# Patient Record
Sex: Male | Born: 1975 | Hispanic: Yes | Marital: Married | Smoking: Never smoker
Health system: Northeastern US, Community
[De-identification: ages and names within clinical notes are randomized; demographics above are authoritative.]

---

## 2000-11-19 ENCOUNTER — Ambulatory Visit: Payer: Self-pay | Admitting: Family Medicine

## 2000-11-19 ENCOUNTER — Ambulatory Visit (HOSPITAL_BASED_OUTPATIENT_CLINIC_OR_DEPARTMENT_OTHER): Payer: Self-pay | Admitting: Family Medicine

## 2001-07-12 ENCOUNTER — Ambulatory Visit: Payer: Self-pay | Admitting: Family Medicine

## 2001-08-25 ENCOUNTER — Emergency Department (HOSPITAL_BASED_OUTPATIENT_CLINIC_OR_DEPARTMENT_OTHER): Payer: Self-pay | Admitting: Emergency Medicine

## 2001-08-30 LAB — ORBITS  3 VIEWS

## 2004-08-29 ENCOUNTER — Emergency Department: Payer: Self-pay

## 2004-08-30 ENCOUNTER — Emergency Department: Payer: Self-pay | Admitting: Emergency Medicine

## 2004-09-01 ENCOUNTER — Ambulatory Visit (HOSPITAL_BASED_OUTPATIENT_CLINIC_OR_DEPARTMENT_OTHER): Payer: Self-pay | Admitting: Ophthalmology

## 2004-10-22 ENCOUNTER — Emergency Department: Payer: Self-pay | Admitting: Emergency Medicine

## 2004-10-25 LAB — THROAT CULTURE BETA STREP

## 2004-10-31 ENCOUNTER — Ambulatory Visit (HOSPITAL_BASED_OUTPATIENT_CLINIC_OR_DEPARTMENT_OTHER): Payer: Self-pay | Admitting: Internal Medicine

## 2004-10-31 ENCOUNTER — Emergency Department (HOSPITAL_BASED_OUTPATIENT_CLINIC_OR_DEPARTMENT_OTHER): Payer: Self-pay | Admitting: Emergency Medicine

## 2004-10-31 LAB — XR CHEST 2 VIEWS

## 2004-11-02 LAB — THROAT CULTURE BETA STREP

## 2004-11-12 ENCOUNTER — Ambulatory Visit: Payer: Self-pay

## 2005-06-03 ENCOUNTER — Ambulatory Visit (HOSPITAL_BASED_OUTPATIENT_CLINIC_OR_DEPARTMENT_OTHER): Payer: Charity | Admitting: Ophthalmology

## 2005-06-12 ENCOUNTER — Ambulatory Visit (HOSPITAL_BASED_OUTPATIENT_CLINIC_OR_DEPARTMENT_OTHER): Payer: Charity | Admitting: Ophthalmology

## 2005-06-12 DIAGNOSIS — H25049 Posterior subcapsular polar age-related cataract, unspecified eye: Principal | ICD-10-CM

## 2005-06-26 ENCOUNTER — Other Ambulatory Visit (HOSPITAL_BASED_OUTPATIENT_CLINIC_OR_DEPARTMENT_OTHER): Payer: Charity

## 2005-07-10 ENCOUNTER — Ambulatory Visit: Payer: Self-pay

## 2005-07-10 ENCOUNTER — Other Ambulatory Visit (HOSPITAL_BASED_OUTPATIENT_CLINIC_OR_DEPARTMENT_OTHER): Payer: Charity

## 2005-07-10 DIAGNOSIS — H269 Unspecified cataract: Principal | ICD-10-CM

## 2005-07-10 DIAGNOSIS — Z23 Encounter for immunization: Secondary | ICD-10-CM

## 2005-07-10 DIAGNOSIS — Z124 Encounter for screening for malignant neoplasm of cervix: Secondary | ICD-10-CM

## 2005-07-10 LAB — BLOOD COUNT COMPLETE AUTOMATED
HEMATOCRIT: 49 % (ref 42.0–52.0)
HEMOGLOBIN: 16.5 g/dL (ref 14.0–18.0)
MEAN CORP HGB CONC: 33.6 g/dL (ref 32.0–36.0)
MEAN CORPUSCULAR HGB: 31.1 pg — ABNORMAL HIGH (ref 27.0–31.0)
MEAN CORPUSCULAR VOL: 92.4 fL (ref 80.0–94.0)
MEAN PLATELET VOLUME: 8.5 fL (ref 6.4–10.8)
PLATELET COUNT: 317 10*3/uL (ref 150–400)
RBC DISTRIBUTION WIDTH: 11.4 % — ABNORMAL LOW (ref 11.5–14.3)
RED BLOOD CELL COUNT: 5.3 MIL/uL (ref 4.70–6.10)
WHITE BLOOD CELL COUNT: 9.3 10*3/uL (ref 4.8–10.8)

## 2005-07-10 LAB — GLUCOSE FASTING: GLUCOSE FASTING: 85 mg/dl (ref 74–118)

## 2005-07-10 LAB — CHG LIPID PANEL
Cholesterol: 161 mg/dl (ref 0–200)
HIGH DENSITY LIPOPROTEIN: 44 mg/dl (ref 29–71)
LOW DENSITY LIPOPROTEIN DIRECT: 116 mg/dl — ABNORMAL HIGH (ref 0–100)
RISK FACTOR: 3.7 (ref ?–5.0)
TRIGLYCERIDES: 36 mg/dl (ref 0–150)

## 2005-07-10 LAB — APTT: APTT: 29.4 SECONDS (ref 22.0–32.8)

## 2005-07-10 LAB — PROTHROMBIN TIME
INR: 1 — ABNORMAL LOW (ref 2.0–3.5)
PROTHROMBIN TIME: 12.4 SECONDS (ref 11.5–13.5)

## 2005-07-13 ENCOUNTER — Other Ambulatory Visit (HOSPITAL_BASED_OUTPATIENT_CLINIC_OR_DEPARTMENT_OTHER): Payer: Charity

## 2005-07-13 DIAGNOSIS — H2510 Age-related nuclear cataract, unspecified eye: Principal | ICD-10-CM

## 2005-07-15 ENCOUNTER — Encounter (HOSPITAL_BASED_OUTPATIENT_CLINIC_OR_DEPARTMENT_OTHER): Payer: Self-pay | Admitting: Ophthalmology

## 2005-07-15 HISTORY — PX: CATARACT REMOVAL INSERTION OF LENS: OPH121

## 2005-07-16 ENCOUNTER — Encounter (HOSPITAL_BASED_OUTPATIENT_CLINIC_OR_DEPARTMENT_OTHER): Payer: Charity | Admitting: Ophthalmology

## 2005-07-16 DIAGNOSIS — H2510 Age-related nuclear cataract, unspecified eye: Principal | ICD-10-CM

## 2005-07-16 LAB — ~~LOC~~-OPERATIVE REPORT

## 2005-07-23 ENCOUNTER — Ambulatory Visit (HOSPITAL_BASED_OUTPATIENT_CLINIC_OR_DEPARTMENT_OTHER): Payer: Charity | Admitting: Ophthalmology

## 2005-07-23 DIAGNOSIS — H26109 Unspecified traumatic cataract, unspecified eye: Principal | ICD-10-CM

## 2005-07-29 ENCOUNTER — Encounter: Payer: Charity | Admitting: Ophthalmology

## 2005-07-29 DIAGNOSIS — H2510 Age-related nuclear cataract, unspecified eye: Principal | ICD-10-CM

## 2005-08-19 ENCOUNTER — Ambulatory Visit (HOSPITAL_BASED_OUTPATIENT_CLINIC_OR_DEPARTMENT_OTHER): Payer: Charity | Admitting: Ophthalmology

## 2005-08-19 DIAGNOSIS — H2510 Age-related nuclear cataract, unspecified eye: Secondary | ICD-10-CM

## 2005-08-19 DIAGNOSIS — H35379 Puckering of macula, unspecified eye: Secondary | ICD-10-CM

## 2005-08-19 DIAGNOSIS — S0510XA Contusion of eyeball and orbital tissues, unspecified eye, initial encounter: Principal | ICD-10-CM

## 2005-08-26 ENCOUNTER — Emergency Department: Payer: Self-pay

## 2005-09-10 ENCOUNTER — Emergency Department: Payer: Self-pay | Admitting: Emergency Medicine

## 2005-11-18 ENCOUNTER — Ambulatory Visit (HOSPITAL_BASED_OUTPATIENT_CLINIC_OR_DEPARTMENT_OTHER): Payer: Self-pay | Admitting: Ophthalmology

## 2006-08-19 ENCOUNTER — Emergency Department: Payer: Self-pay | Admitting: Emergency Medicine

## 2006-08-22 LAB — THROAT CULTURE BETA STREP

## 2006-08-23 LAB — EMERGENCY ROOM NOTE

## 2006-09-08 ENCOUNTER — Emergency Department: Payer: Self-pay | Admitting: Emergency Medicine

## 2006-09-08 LAB — XR CHEST 2 VIEWS

## 2006-09-14 ENCOUNTER — Ambulatory Visit: Payer: PRIVATE HEALTH INSURANCE | Admitting: Internal Medicine

## 2006-09-16 LAB — EMERGENCY ROOM NOTE

## 2006-09-24 ENCOUNTER — Ambulatory Visit: Payer: PRIVATE HEALTH INSURANCE | Admitting: Internal Medicine

## 2006-09-24 VITALS — BP 105/70 | HR 76 | Temp 97.6°F | Wt 115.0 lb

## 2006-09-24 DIAGNOSIS — J209 Acute bronchitis, unspecified: Principal | ICD-10-CM

## 2006-09-24 DIAGNOSIS — R634 Abnormal weight loss: Secondary | ICD-10-CM

## 2006-09-24 NOTE — Progress Notes (Signed)
Former patient of Dr. Volney American.     1. ER follow-up: bronchitis. Resolved. No smoking.     2. Occasional L abdo pain/gas after meals. No certain food link. No diarrhea. No melena.     3. Occasional white spots in tonsilar regions. Hx of surgery for tonsils.     4. Unhappy with body habitus. Wishes larger, stronger frame.    Patient Active Problem List:   CATARACT NOS [366.9]    No current outpatient prescriptions on file prior to 09/24/06.    Review of Patient's Allergies indicates:   Nkda (no known drug*     Social History Main Topics   Tobacco Use: Never    Alcohol Use: No    Drug Use: No      BP 105/70  Pulse 76  Temp 97.6  Wt 115 lbs (52.2kg)  SaO2 99%  Thin, but well appearing M.   No oral lesions.  No neck nodes.  CTA  Reg rhythm. Trace early syst murmur LSB.  No LE edema.     A/P:    466.0 ACUTE BRONCHITIS (primary encounter diagnosis)  Note: Treated through the ED. Resolved.   Plan: F/u if recurrent symptoms.    783.2 ABNORMAL LOSS WEIGHT/UNDERWEIGHT  Note: Small frame. Lifelong. Wishes larger, stronger. Not using any anabolic Rx's.   Plan: Reviewed good nutrition, safe protein intake, weight training.   Cautioned about the dangers of anabolic steroids and other growth/performance enhancers.   Nutrition referral if desired. Prefers to wait for now.

## 2006-09-24 NOTE — Patient Instructions (Signed)
1. Come mas proteina.  2. [employer] brand protein supplements.

## 2006-10-20 ENCOUNTER — Emergency Department: Payer: Self-pay | Admitting: Emergency Medicine

## 2006-10-20 LAB — EMERGENCY ROOM NOTE

## 2007-01-07 ENCOUNTER — Ambulatory Visit: Payer: PRIVATE HEALTH INSURANCE | Admitting: Internal Medicine

## 2007-02-21 ENCOUNTER — Emergency Department (HOSPITAL_BASED_OUTPATIENT_CLINIC_OR_DEPARTMENT_OTHER)
Admission: RE | Admit: 2007-02-21 | Disposition: A | Discharge status: Home / Self Care | Payer: Self-pay | Source: Emergency Department | Attending: Emergency Medicine | Admitting: Emergency Medicine

## 2007-02-21 LAB — XR CHEST 2 VIEWS

## 2007-02-26 LAB — EMERGENCY ROOM NOTE

## 2007-09-21 ENCOUNTER — Other Ambulatory Visit: Payer: Self-pay

## 2007-10-12 ENCOUNTER — Ambulatory Visit (HOSPITAL_BASED_OUTPATIENT_CLINIC_OR_DEPARTMENT_OTHER): Payer: Self-pay | Admitting: Internal Medicine

## 2007-12-12 NOTE — Telephone Encounter (Signed)
Former pt of Dr Berneda Rose not seen since 09/24/06. Letter sent for pt to establish new PCP.

## 2008-11-01 ENCOUNTER — Encounter (HOSPITAL_BASED_OUTPATIENT_CLINIC_OR_DEPARTMENT_OTHER): Payer: Self-pay | Admitting: Ophthalmology

## 2008-11-01 ENCOUNTER — Ambulatory Visit (HOSPITAL_BASED_OUTPATIENT_CLINIC_OR_DEPARTMENT_OTHER): Payer: PRIVATE HEALTH INSURANCE | Admitting: Ophthalmology

## 2008-11-01 DIAGNOSIS — Z961 Presence of intraocular lens: Secondary | ICD-10-CM | POA: Insufficient documentation

## 2008-11-01 DIAGNOSIS — T1500XA Foreign body in cornea, unspecified eye, initial encounter: Principal | ICD-10-CM

## 2008-11-01 MED ORDER — VIGAMOX 0.5 % OP SOLN
OPHTHALMIC | Status: AC
Start: 2008-11-01 — End: 2008-11-01

## 2008-11-01 NOTE — Progress Notes (Signed)
Devin Kemp was seen c.o. Pain OD secondary to a metal EFB OD.  The metal was removed from his cornea OD.  Use vigamox qid x 5 days.  F/U in 4-5 days.

## 2008-11-01 NOTE — Nursing Note (Signed)
>>   Dina Luis-Travassos,OT     Thu Nov 01, 2008 11:51 AM  Redness OD, Severe FB Sens. OU X's 1 month, worse X's 5 days.  Pt unable to sleep from eye discomfort.

## 2008-11-07 ENCOUNTER — Ambulatory Visit (HOSPITAL_BASED_OUTPATIENT_CLINIC_OR_DEPARTMENT_OTHER): Payer: PRIVATE HEALTH INSURANCE | Admitting: Ophthalmology

## 2008-11-07 DIAGNOSIS — T1500XA Foreign body in cornea, unspecified eye, initial encounter: Principal | ICD-10-CM

## 2008-11-07 NOTE — Progress Notes (Signed)
Devin Kemp was seen s/p removal of metal EFB OD.  He has a small amt. Of residual rust with irreg overlying epithelium.  Cont vigamox tid and f/u 5-7 days/prn.

## 2008-11-07 NOTE — Nursing Note (Signed)
>>   Devin Kemp,OT     Wed Nov 07, 2008  8:31 AM  1 week F/U - FB metal removed OD.  Pt still feels FB sens OD.

## 2008-11-08 HISTORY — PX: POST-CATARACT LASER SURGERY: OPH84

## 2008-11-15 ENCOUNTER — Ambulatory Visit (HOSPITAL_BASED_OUTPATIENT_CLINIC_OR_DEPARTMENT_OTHER): Payer: PRIVATE HEALTH INSURANCE | Admitting: Ophthalmology

## 2008-11-15 DIAGNOSIS — H2640 Unspecified secondary cataract: Secondary | ICD-10-CM

## 2008-11-15 NOTE — Nursing Note (Signed)
>>   Elizabeth Silva,OT     Thu Nov 15, 2008  8:41 AM  Here for f/u corneal fb od/w rust ring, no more pain feels fine, forgets to use drop    Vigamox od tid last used 2 days ago.

## 2008-11-15 NOTE — Progress Notes (Signed)
Devin Kemp was seen for f/u metal EFB of cornea OD.  The EFB is gone with small amt. Of residual rust staining but no epithelial defect.    The patient has noted a signif decrease in vision which is due to an opacified posterior capsule.  Will sched. A YAG laser capsulotomy OD N/A.

## 2008-11-19 ENCOUNTER — Encounter (HOSPITAL_BASED_OUTPATIENT_CLINIC_OR_DEPARTMENT_OTHER): Payer: Self-pay

## 2008-12-04 ENCOUNTER — Ambulatory Visit: Payer: PRIVATE HEALTH INSURANCE | Admitting: Ophthalmology

## 2008-12-04 DIAGNOSIS — H2640 Unspecified secondary cataract: Principal | ICD-10-CM

## 2008-12-04 MED ORDER — PRED FORTE 1 % OP SUSP
OPHTHALMIC | Status: AC
Start: 2008-12-04 — End: 2009-12-04

## 2008-12-04 NOTE — Progress Notes (Signed)
The patient is having a Neodymium Yag laser capsulotomy of the right eye.  The risks, benefits and alternatives of Yag laser capsulotomy have been discussed, and the patient has had his/her questions answered satisfactorily.    PATIENT/PROCEDURE VERIFICATION DOCUMENTATION    Correct patient: Yes  Correct procedure: Yes  Correct side, site, mark visible if applicable: Yes  Correct position: Yes  Special equipment/implant(s) present, if applicable: Yes    Time-out completed, documented by provider doing procedure or designated team member:  Patton Salles    12/04/2008    4:48 PM      Neodymium Yag Laser Capsulotomy Record    Power 1.5 milliwatts    Number of shots 46*    Drawing created? No    The patient tolerated well? Yes.    The patient was prescribed Econopred 1%, one drop to the right eye, four times a day for three days.  Follow up one week to check the intraocular pressure.    If the ophthalmologist recorded the intraocular pressure in right eye, 20 minutes after the procedure the record IOP (mm HG): 20 at 5:30 PM

## 2008-12-04 NOTE — Nursing Note (Signed)
>>   Devin Kemp     Tue Dec 04, 2008  5:31 PM  For yag OD

## 2008-12-05 ENCOUNTER — Encounter (HOSPITAL_BASED_OUTPATIENT_CLINIC_OR_DEPARTMENT_OTHER): Payer: Self-pay

## 2008-12-05 ENCOUNTER — Encounter: Payer: Self-pay | Admitting: Ophthalmology

## 2008-12-05 ENCOUNTER — Emergency Department (HOSPITAL_BASED_OUTPATIENT_CLINIC_OR_DEPARTMENT_OTHER)
Admission: RE | Admit: 2008-12-05 | Disposition: A | Discharge status: Home / Self Care | Payer: Self-pay | Source: Emergency Department | Attending: Emergency Medicine | Admitting: Emergency Medicine

## 2008-12-05 MED ORDER — CLARITIN 10 MG PO TABS
ORAL_TABLET | ORAL | Status: AC
Start: 2008-12-05 — End: 2008-12-19

## 2008-12-05 MED ORDER — ZANTAC 150 MG PO TABS
ORAL_TABLET | ORAL | Status: AC
Start: 2008-12-05 — End: 2009-01-04

## 2008-12-05 MED ORDER — BENADRYL 25 MG PO TABS
ORAL_TABLET | ORAL | Status: AC
Start: 2008-12-05 — End: 2009-01-04

## 2008-12-05 NOTE — ED Notes (Signed)
Pt c/o epigatric pain for 2 days.  Pt states he took a [place] "liver pill" at noon yesterday w/o relief. Pt woke this am at 1:30 with rash to arms, denies N/V or SOB.

## 2008-12-05 NOTE — Discharge Instructions (Signed)
Please provide the following to the patient in [place]:    Your rash likely comes from an allergic reaction.  However, we cannot tell what is causing it.  If you come into contact with the same substance again your symptoms may worsen.  Allergic reactions may rarely become severe or even life threatening, so please return here immediately if your symptoms change or worsen or if you have any new problems!    We do not know the cause of your abdominal pain.  If it comes back you should return or follow up with your primary care doctor.

## 2008-12-06 LAB — EMERGENCY ROOM NOTE

## 2011-07-31 ENCOUNTER — Encounter (HOSPITAL_BASED_OUTPATIENT_CLINIC_OR_DEPARTMENT_OTHER): Payer: Self-pay

## 2011-07-31 ENCOUNTER — Emergency Department (HOSPITAL_BASED_OUTPATIENT_CLINIC_OR_DEPARTMENT_OTHER)
Admission: RE | Admit: 2011-07-31 | Disposition: A | Discharge status: Home / Self Care | Payer: Self-pay | Source: Emergency Department | Admitting: Physician Assistant

## 2011-07-31 LAB — RAPID STREP (POINT OF CARE): STREP SCREEN: NEGATIVE

## 2011-07-31 MED ORDER — MENTHOL 3 MG MT LOZG
1.00 | LOZENGE | OROMUCOSAL | Status: AC | PRN
Start: 2011-07-31 — End: 2011-08-07

## 2011-07-31 MED ORDER — ONDANSETRON 4 MG PO TBDP
4.00 mg | ORAL_TABLET | Freq: Once | ORAL | Status: AC
Start: 2011-07-31 — End: 2011-07-31
  Administered 2011-07-31: 4 mg via ORAL
  Filled 2011-07-31: qty 1

## 2011-07-31 NOTE — Discharge Instructions (Signed)
You were seen in the emergency department today for a sore throat.  This is caused by a virus.  You received antinausea medication in the emergency department.  You're being sent home with a prescription for throat lozenges which will help to relieve the sore throat.  In addition, take Motrin and Tylenol as needed for pain or fever.  Stay well hydrated and get plenty of rest.  See your doctor in 3 days if symptoms persist, return to the emergency department for any worsening symptoms.

## 2011-07-31 NOTE — ED Notes (Signed)
Patient Disposition    Patient education for diagnosis, medications, activity, diet and follow-up.  Patient left ED 5:46 PM.  Patient rep received written instructions.  Interpreter to provide instructions: Yes    Discharged to: Discharged to home, pt given for rx for cepacol lozenges, instructed to follow up with pcp, reviewed s/s to return to ed for, pt verbalizes understanding, states no other questions or concerns, pt ambulatory at dc.

## 2011-07-31 NOTE — ED Triage Note (Signed)
Pt reporting a sore throat, fever and non productive cough that started yesterday, has been taking motrin, tylenol and a [place] medication to control pain and fever.

## 2011-07-31 NOTE — ED Provider Notes (Signed)
ED nursing record was reviewed. Prior records as available electronically through the Epic record were reviewed.    Patient was seen primarily by me.      HPI:    This 35 year old male patient presents to the Emergency Department with chief complaint of sore throat x24 hours.  Patient states that he awoke yesterday morning and had a sore throat which became worse into today.  He also reports a subjective fever last night and a nonproductive cough.  He has been taking both Motrin and Tylenol (last dose at 1500) and Amoxicillin (?) which he says he obtained at a [place] [facility].  He reports several sick contacts in his immediate family.  Has felt mildly nauseas but no episodes of vomiting.  No recent travel, no abdominal pain, diarrhea, sputum production.        ROS: Pertinent positives were reviewed as per the HPI above. All other systems were reviewed and are negative.      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 Patalano, MD           Medications:   No current facility-administered medications on file prior to encounter.  Current outpatient prescriptions ordered prior to encounter:  VIGAMOX 0.5 % OP SOLN 1 gtt OU QID Disp:  Rfl:            Social History:   Social History   Marital Status: Married  Spouse Name: N/A    Years of Education: N/A  Number of Children: N/A     Occupational History  None on file     Social History Main Topics   Smoking status: Never Smoker     Smokeless tobacco:     Alcohol Use: No    Drug Use: No    Sexually Active: Not on file     Other Topics Concern   None on file     Social History Narrative   None on file           Allergies:  Review of Patient's Allergies indicates:   Nkda (no known drug*           Physical Exam:  BP 119/81  Pulse 98  Temp 98.1 F  Resp 16  Wt 54.432 kg  SpO2 99%    GENERAL: No acute distress, non-toxic.   SKIN:  Warm & Dry, no rash.  HEAD:  NCAT. Sclerae are anicteric and aninjected, oropharynx is erythematous but without tonsillar enlargement or exudate. PERRL. EOMI. B TMs clear.  NECK:  No C-spine tenderness, crepitus, step-off.  No meningismus.  No LAN. No stridor.  LUNGS:  Clear to auscultation bilaterally. No wheezes, rales, rhonchi.   HEART:  RRR.  No murmurs, rubs, or gallops.   ABDOMEN:  Soft, NTND.  No hepatosplenomegaly.  No masses.  No involuntary guarding or rebound.   EXTREMITIES:  No obvious deformities.  CSM intact x 4.     NEUROLOGIC:  Alert and oriented x4; moves all extremities well; speaking in clear fluent sentences. Sensation intact to light touch throughout. 5/5 strength globally.  PSYCHIATRIC:  Appropriate for age, time of day, and situation        ED Course and Medical Decision-making:  This 35 year old male patient presents with a sore throat and nonproductive cough x24 hours.  In the emergency department he is afebrile with normal vital signs.  On inspection of his posterior oropharynx is erythematous but without any tonsillar enlargement or exudate.  No cervical lymphadenopathy.  Lungs are clear to auscultation bilaterally.  During the exam, patient reported that he felt mildly nauseous.  He was medicated with Zofran sublingual with good effect.  Rapid strep was obtained, this was negative.  Likely a viral pharyngitis and upper respiratory infection.  Patient will be sent home with a prescription for Cepacol lozenges and was instructed to continue with both Motrin and Tylenol for fever/pain.  In addition he was provided with information regarding symptomatic management of viral illnesses.      Condition: Improved and stable  Disposition: Home      Diagnosis/Diagnoses:  462V Viral pharyngitis     Harout Scheurich L. Jodette Wik, PA-C

## 2011-08-02 LAB — THROAT CULTURE BETA STREP

## 2012-11-17 ENCOUNTER — Encounter (HOSPITAL_BASED_OUTPATIENT_CLINIC_OR_DEPARTMENT_OTHER): Payer: Self-pay | Admitting: Internal Medicine

## 2013-01-10 ENCOUNTER — Emergency Department (HOSPITAL_BASED_OUTPATIENT_CLINIC_OR_DEPARTMENT_OTHER)
Admission: RE | Admit: 2013-01-10 | Disposition: A | Discharge status: Home / Self Care | Payer: Self-pay | Source: Emergency Department | Attending: Emergency Medicine | Admitting: Emergency Medicine

## 2013-01-10 ENCOUNTER — Encounter (HOSPITAL_BASED_OUTPATIENT_CLINIC_OR_DEPARTMENT_OTHER): Payer: Self-pay

## 2013-01-10 MED ORDER — POLYMYXIN B-TRIMETHOPRIM 10000-0.1 UNIT/ML-% OP SOLN
1.00 [drp] | OPHTHALMIC | Status: AC
Start: 2013-01-10 — End: 2013-01-15

## 2013-01-10 MED ORDER — ERYTHROMYCIN 5 MG/GM OP OINT
0.50 [in_us] | TOPICAL_OINTMENT | Freq: Every evening | OPHTHALMIC | Status: AC
Start: 2013-01-10 — End: 2013-01-15

## 2013-01-10 NOTE — ED Notes (Signed)
Patient Disposition    Patient education for diagnosis, medications, activity, diet and follow-up.  Patient left ED 10:05 AM.  Patient rep received written instructions.  Interpreter to provide instructions: Yes     Discharged to home ambulatory. Verbalized understanding of d/c instructions.   Rx x 2 given.

## 2013-01-10 NOTE — ED Triage Note (Signed)
Onset of red eye with yellow discharge yesterday. Woke up today with mild lid swelling as well. Mild pain and itchiness. Denies vision changes.

## 2013-01-10 NOTE — ED Provider Notes (Signed)
I have reviewed the ED nursing notes and prior records. I have reviewed the patient's past medical history/problem list, allergies, social history and medication list.  I saw this patient primarily.        HPI:  This 37 year old male patient presents with chief complaint of right eye redness.    Symptoms started gradually yesterday.  Symptoms have been constant.  He reports associated purulent discharge.  He reports periorbital edema since today.  He denies any pain in his eye or visual changes.  He denies any associated fevers, cough, rhinorrhea or sore throat.    ROS: Pertinent positives were reviewed as per the HPI above. All other systems were reviewed and are negative.    Past Medical History/Problem List:    Past Medical History    NO KNOWN PROBLEMS      Patient Active Problem List:     Pseudophakia     After Cataract      Past Surgical History:      Past Surgical History    CATARACT REMOVAL INSERTION OF LENS  07/15/2005    Comment RIGHT EYE - [id number] Jari Favre. Patalano, MD       Medications:     No current facility-administered medications on file prior to encounter.  No current outpatient prescriptions on file prior to encounter.    Social History:     Smoking status: Never Smoker     Smokeless tobacco: Not on file    Alcohol Use: No       Allergies:  Review of Patient's Allergies indicates:   Nkda (please verify*        Physical Exam:  BP 105/71  Pulse 71  Temp(Src) 98 F  Resp 16  SpO2 100%  GENERAL:  Well-appearing, no distress.  SKIN:  Warm & Dry, no rash, no bruising.  HEAD:  Atraumatic. PERRL. EOMI. right conjunctiva injected, no discharge present.  Mild right periorbital edema.  Oropharynx clear.  NECK:  Supple, no midline tenderness, no LAD.    ED Course and Medical Decision-making:    The patient is 37 year old male with conjunctivitis.  Patient was started on topical antibiotic therapy, and advised followup with his PCP.    Reasons to return to the ED were reviewed in detail. The patient  agrees with this plan and disposition.    Disposition:  Discharged to home    Condition: Stable    Diagnosis/Diagnoses:  Conjunctivitis          Leroy Sea, MD, FACEP  Site Chief,  [facility]  [facility]

## 2013-01-10 NOTE — ED Notes (Signed)
MD at bedside for eval.

## 2013-05-06 ENCOUNTER — Emergency Department (HOSPITAL_BASED_OUTPATIENT_CLINIC_OR_DEPARTMENT_OTHER)
Admission: RE | Admit: 2013-05-06 | Disposition: A | Discharge status: Home / Self Care | Payer: Self-pay | Source: Emergency Department | Attending: Emergency Medicine | Admitting: Emergency Medicine

## 2013-05-07 ENCOUNTER — Encounter (HOSPITAL_BASED_OUTPATIENT_CLINIC_OR_DEPARTMENT_OTHER): Payer: Self-pay | Admitting: Emergency Medicine

## 2013-05-07 LAB — COMPREHENSIVE METABOLIC PANEL
ALANINE AMINOTRANSFERASE: 55 U/L — ABNORMAL HIGH (ref 12–45)
ALBUMIN: 3.8 g/dL (ref 3.4–5.0)
ALKALINE PHOSPHATASE: 76 U/L (ref 45–117)
ANION GAP: 6 mmol/L (ref 5–15)
ASPARTATE AMINOTRANSFERASE: 25 U/L (ref 8–34)
BILIRUBIN TOTAL: 0.4 mg/dL (ref 0.2–1.0)
BUN (UREA NITROGEN): 18 mg/dL (ref 7–18)
CALCIUM: 8.6 mg/dL (ref 8.5–10.1)
CARBON DIOXIDE: 32 mmol/L (ref 21–32)
CHLORIDE: 103 mmol/L (ref 98–107)
CREATININE: 1.1 mg/dL (ref 0.7–1.2)
ESTIMATED GLOMERULAR FILT RATE: >60 mL/min (ref >60)
Glucose Random: 106 mg/dL (ref 74–160)
POTASSIUM: 4.4 mmol/L (ref 3.5–5.1)
SODIUM: 141 mmol/L (ref 136–145)
TOTAL PROTEIN: 6.7 g/dL (ref 6.4–8.2)

## 2013-05-07 LAB — CBC WITH PLATELET
HEMATOCRIT: 41.2 % (ref 40.1–51.0)
HEMOGLOBIN: 14.1 g/dL (ref 13.7–17.5)
MEAN CORP HGB CONC: 34.2 g/dL (ref 31.0–37.0)
MEAN CORPUSCULAR HGB: 31 pg (ref 26.0–34.0)
MEAN CORPUSCULAR VOL: 90.5 fL (ref 80.0–100.0)
MEAN PLATELET VOLUME: 9.5 fL (ref 8.7–12.5)
PLATELET COUNT: 274 10*3/uL (ref 150–400)
RBC DISTRIBUTION WIDTH STD DEV: 37.5 fL (ref 35.1–46.3)
RBC DISTRIBUTION WIDTH: 11.5 % (ref 11.5–14.3)
RED BLOOD CELL COUNT: 4.55 M/uL — ABNORMAL LOW (ref 4.60–6.10)
WHITE BLOOD CELL COUNT: 8.5 10*3/uL (ref 4.0–11.0)

## 2013-05-07 LAB — URINALYSIS
BILIRUBIN, URINE: NEGATIVE
CASTS: NONE SEEN PER LPF
CRYSTALS: NONE SEEN
GLUCOSE, URINE: NEGATIVE MG/DL
KETONE, URINE: NEGATIVE MG/DL
LEUKOCYTE ESTERASE: NEGATIVE
NITRITE, URINE: POSITIVE — AB
OCCULT BLOOD, URINE: NEGATIVE
PH URINE: 6.5 (ref 5.0–8.0)
PROTEIN, URINE: NEGATIVE MG/DL
SPECIFIC GRAVITY URINE: 1.02 (ref 1.003–1.035)

## 2013-05-07 LAB — POC URINALYSIS
BILIRUBIN, URINE: NEGATIVE
GLUCOSE,URINE: NEGATIVE
LEUKOCYTE ESTERASE: NEGATIVE
NITRITE, URINE: NEGATIVE
OCCULT BLOOD, URINE: NEGATIVE
PH URINE: 7 (ref 5.0–8.0)
PROTEIN, URINE: NEGATIVE
SPECIFIC GRAVITY, URINE: 1.025 (ref 1.003–1.030)
UROBILINOGEN URINE: 1 — AB (ref 0.2–1.0)

## 2013-05-07 LAB — CT ABDOMEN & PELVIS WO IV CONTRAST

## 2013-05-07 MED ORDER — SODIUM CHLORIDE 0.9 % IV BOLUS
1000.00 mL | Freq: Once | INTRAVENOUS | Status: AC
Start: 2013-05-07 — End: 2013-05-07
  Administered 2013-05-07: 1000 mL via INTRAVENOUS

## 2013-05-07 NOTE — Discharge Instructions (Signed)
You were seen in the ER for right side pain.    Your CT scan showed you have very small kidney stones on both sides.  It also looks like you just passed a stone on your right side.  That may be why it felt funny when you urinated tonight.   Since your CT scan was not entirely normal, it is important to follow up with your doctor this week about it. Dr. Lodema Pilot was your doctor before.  If he can see you again, that's great.  If not, call [phone] on Monday at 9 am for a new doctor and make the first available appointment.    Rest and drink plenty of fluids.  Use ibuprofen for pain, but your pain should be much better now.  If not, you need to come back.    RETURN TO ER right away for fevers, vomiting, severe or increased pain, trouble urinating, or for any other concerns.

## 2013-05-07 NOTE — ED Provider Notes (Signed)
I have reviewed the ED nursing notes and prior records. I have reviewed the patient's past medical history/problem list, allergies, social history and medication list.  I saw this patient primarily.  [place] interpreter was used.    HPI:  This is a 37 year old male patient presenting with a chief complaint of right flank pain for 5 days, constant, worse with certain movements, better with ibuprofen, which he took 2 hours ago, so his pain is minimal now.  Had nausea and felt like he was going to throw up the first day of the pain, none now.  Just prior to arrival, he urinated and felt some discomfort while urinating but otherwise has had absolutely no urinary symptoms.  No penile discharge.  He reports a history of similar many years ago that resolved when he drank more water, so he never sought care.  The pain sometimes radiates down to his groin but he has no penile or scrotal pain.  No chest pain or trouble breathing.  No incontinence or numbness or weakness.    ROS: Pertinent positives were reviewed as per the HPI above. All other systems were reviewed and are negative.    Past Medical History/Problem List:    Past Medical History    NO KNOWN PROBLEMS      Patient Active Problem List:     Pseudophakia     After Cataract      Past Surgical History:      Past Surgical History    CATARACT REMOVAL INSERTION OF LENS  07/15/2005    Comment RIGHT EYE - [id number] Jari Favre. Patalano, MD       Medications:     No current facility-administered medications on file prior to encounter.  No current outpatient prescriptions on file prior to encounter.    Social History:     Smoking status: Never Smoker     Smokeless tobacco: Not on file    Alcohol Use: No       Allergies:  Review of Patient's Allergies indicates:   Rise Patience verify*        Physical Exam:   05/07/13  0005   BP: 109/72   Pulse: 76   Temp: 98 F   Resp: 18   Weight: 53.524 kg   SpO2: 98%       GEN: No acute distress, appears comfortable  HEENT: Atraumatic,  normocephalic.  PERRL, EOMI.  Conjunctiva clear.  Oropharynx with moist mucus membranes.  Neck supple.  CARDIOVASC: Regular rate and rhythm, no murmurs, rubs, and gallops.  No peripheral edema.  RESP: No respiratory distress, good air exchange, breath sounds CTAB.  ABD: Soft, nontender, nondistended, nl bowel sounds. No organomegaly noted. Patient refused groin exam.  No CVAT.  Neuro: Alert and oriented, CN III-XII normal, 5/5 bilateral upper and lower extremity strength, normal sensation to light touch x 4 including groin, steady gait  Back: No spinal tenderness to palpation or deformity, no CVAT, no rash  PSYCH: Appropriate affect and thought  SKIN: Warm, dry, no rashes or bruising       ED Course and Medical Decision-making:    The patient is a 37 year old male with right flank pain for 5 days initially with nausea but now none.  Controlled well with ibuprofen.  This evening developed some urinary symptoms.  His history suggests renal colic, although he appears comfortable here.  We checked his urine, and there was no blood although it was very pink.  I later  learned this is because he is a lot of beats.  Pyelonephritis to be in the differential as well, but urinalysis shows nothing to suggest this.  I suspect positive nitrate is because of the discoloration from the beets, as noted in the results.  There were no preceding urinary symptoms to suggest infection either.  Will check computed tomography scan to look for stone.  Also to be musculoskeletal although not reproducible on exam.  -->  Patient's labs are normal.  His computed tomography scan shows bilateral small kidney stones and mild fullness on the right kidney and ureter, suggesting a recently passed stone.  This would also be consistent with his report that he had some discomfort while urinating just prior to arrival, suggesting that he just passed this before he got here.  This would also explain why he is so comfortable appearing here.  I discussed  all this with the patient with an interpreter.  I did discuss the abnormalities on the computed tomography scan and the fact that he does need to followup about this.  He agrees.  Return precautions also discussed.      Disposition:  Discharged to home    Condition on  Discharge:  Improved and Stable    Diagnosis/Diagnoses:  Bilateral kidney stones  Acute right flank pain      Lorenso Courier, MD

## 2013-05-07 NOTE — ED Notes (Signed)
Patient Disposition    Patient education for diagnosis, medications, activity, diet and follow-up.  Patient left ED 2:37 AM.  Patient rep received written instructions.  Interpreter to provide instructions: No  Encouraged to drink more fluids than usual for next several days.  Follow up with your PCP  Discharged to: Discharged to home

## 2013-05-08 LAB — URINE CULTURE: URINE CULTURE/COLONY COUNT: NO GROWTH

## 2013-05-09 ENCOUNTER — Encounter (HOSPITAL_BASED_OUTPATIENT_CLINIC_OR_DEPARTMENT_OTHER): Payer: Self-pay | Admitting: Internal Medicine

## 2013-05-09 ENCOUNTER — Telehealth (HOSPITAL_BASED_OUTPATIENT_CLINIC_OR_DEPARTMENT_OTHER): Payer: Self-pay | Admitting: Internal Medicine

## 2013-05-09 ENCOUNTER — Ambulatory Visit (HOSPITAL_BASED_OUTPATIENT_CLINIC_OR_DEPARTMENT_OTHER): Payer: Medicaid Other | Admitting: Internal Medicine

## 2013-05-09 VITALS — BP 110/70 | HR 81 | Temp 98.0°F | Wt 120.0 lb

## 2013-05-09 DIAGNOSIS — N200 Calculus of kidney: Secondary | ICD-10-CM

## 2013-05-09 DIAGNOSIS — Z0000 Encounter for general adult medical examination without abnormal findings: Principal | ICD-10-CM

## 2013-05-09 LAB — LIPID PANEL
Cholesterol: 183 mg/dL (ref 0–239)
HIGH DENSITY LIPOPROTEIN: 41 mg/dL (ref 40–?)
LOW DENSITY LIPOPROTEIN DIRECT: 121 mg/dL (ref 0–189)
TRIGLYCERIDES: 235 mg/dL — ABNORMAL HIGH (ref 0–150)

## 2013-05-09 LAB — URIC ACID: URIC ACID: 3.6 mg/dL (ref 3.5–7.2)

## 2013-05-09 LAB — PROSTATIC SPECIFIC ANTIGEN: PROSTATIC SPECIFIC ANTIGEN: 0.491 ng/mL (ref 0.000–4.000)

## 2013-05-09 LAB — TSH (THYROID STIMULATING HORMONE): TSH (THYROID STIM HORMONE): 2.22 u[IU]/mL (ref 0.358–3.740)

## 2013-05-15 ENCOUNTER — Ambulatory Visit (HOSPITAL_BASED_OUTPATIENT_CLINIC_OR_DEPARTMENT_OTHER): Payer: Medicaid Other | Admitting: Specialist

## 2013-05-16 ENCOUNTER — Ambulatory Visit: Payer: Self-pay | Admitting: Internal Medicine

## 2013-05-16 LAB — SODIUM 24 HOUR URINE
SODIUM 24HR URINE mmol/L: 34 mmol/L
SODIUM 24HR URINE: 104 mmol/24H (ref 40–220)

## 2013-05-16 LAB — CREATININE 24 HOUR URINE
CREATININE 24HR URINE MG%: 33 mg/dL
CREATININE 24HR URINE: 1 g/(24.h) (ref 0.8–2.0)
TOTAL VOLUME 24 HOUR URINE: 3050 ml/24HR — ABNORMAL HIGH (ref 800–1800)

## 2013-05-16 LAB — URIC ACID 24 HOUR URINE
URIC ACID 24HR URINE mg/dl: 12 mg/dL
URIC ACID 24HR URINE: 366 mg/24HR (ref 250–750)

## 2013-05-16 LAB — CALCIUM 24 HOUR URINE
CALCIUM 24HR URINE mg/dl: <5 mg/dL
CALCIUM 24HR URINE: 152 mg/24HR (ref 42–353)

## 2013-05-29 LAB — CITRATE 24 HOUR URINE
CITRATE URINE: 44 mg/L
CITRATE, 24HR URINE: 132 mg/24 hr — AB (ref 320–1240)
TOTAL VOLUME 24 HOUR URINE: 3000

## 2013-05-29 LAB — OXALATES 24 HOUR URINE
OXALATE 24 HR URINE: 12 mg/24 hr (ref 7–44)
OXALATE URINE: 4 mg/L

## 2013-05-31 ENCOUNTER — Encounter (HOSPITAL_BASED_OUTPATIENT_CLINIC_OR_DEPARTMENT_OTHER): Payer: Self-pay | Admitting: Specialist

## 2013-05-31 ENCOUNTER — Ambulatory Visit (HOSPITAL_BASED_OUTPATIENT_CLINIC_OR_DEPARTMENT_OTHER): Payer: Medicaid Other | Admitting: Specialist

## 2013-05-31 DIAGNOSIS — N4283 Cyst of prostate: Secondary | ICD-10-CM

## 2013-05-31 DIAGNOSIS — N200 Calculus of kidney: Principal | ICD-10-CM

## 2013-05-31 LAB — URINE DIP (POINT OF CARE)
BILIRUBIN, URINE: NEGATIVE
GLUCOSE, URINE: NEGATIVE mg/dl
KETONE, URINE: NEGATIVE mg/dl
LEUKOCYTE ESTERASE: NEGATIVE
NITRITE, URINE: NEGATIVE
OCCULT BLOOD, URINE: NEGATIVE
PH URINE: 7 (ref 5.0–8.0)
PROTEIN, URINE: NEGATIVE mg/dl (ref 0–15)
SPECIFIC GRAVITY URINE: 1.015 (ref 1.003–1.030)
UROBILINOGEN URINE: 0.2 mg/dl (ref 0.2–1.0)

## 2013-06-13 ENCOUNTER — Ambulatory Visit (HOSPITAL_BASED_OUTPATIENT_CLINIC_OR_DEPARTMENT_OTHER): Payer: Medicaid Other | Admitting: Internal Medicine

## 2013-06-14 ENCOUNTER — Encounter (HOSPITAL_BASED_OUTPATIENT_CLINIC_OR_DEPARTMENT_OTHER): Payer: Self-pay | Admitting: Internal Medicine

## 2013-06-14 ENCOUNTER — Ambulatory Visit (HOSPITAL_BASED_OUTPATIENT_CLINIC_OR_DEPARTMENT_OTHER): Payer: Medicaid Other | Admitting: Internal Medicine

## 2013-06-14 VITALS — BP 110/66 | HR 78 | Temp 98.3°F | Wt 125.0 lb

## 2013-06-14 DIAGNOSIS — Z23 Encounter for immunization: Secondary | ICD-10-CM

## 2013-06-14 DIAGNOSIS — N200 Calculus of kidney: Principal | ICD-10-CM

## 2014-03-31 ENCOUNTER — Encounter (HOSPITAL_BASED_OUTPATIENT_CLINIC_OR_DEPARTMENT_OTHER): Payer: Self-pay

## 2014-03-31 ENCOUNTER — Emergency Department (HOSPITAL_BASED_OUTPATIENT_CLINIC_OR_DEPARTMENT_OTHER)
Admission: RE | Admit: 2014-03-31 | Disposition: A | Discharge status: Home / Self Care | Payer: Self-pay | Source: Emergency Department | Attending: Emergency Medicine | Admitting: Emergency Medicine

## 2014-03-31 MED ORDER — DOXYCYCLINE HYCLATE 100 MG PO TABS
100.0000 mg | ORAL_TABLET | Freq: Two times a day (BID) | ORAL | Status: DC
Start: 2014-03-31 — End: 2014-03-31

## 2014-03-31 MED ORDER — TETANUS-DIPHTH-ACELL PERTUSSIS 5-2.5-18.5 LF-MCG/0.5 IM SUSP
0.50 mL | Freq: Once | INTRAMUSCULAR | Status: AC
Start: 2014-03-31 — End: 2014-03-31
  Administered 2014-03-31: 0.5 mL via INTRAMUSCULAR
  Filled 2014-03-31: qty 0.5

## 2014-03-31 MED ORDER — CLINDAMYCIN HCL 150 MG PO CAPS
300.00 mg | ORAL_CAPSULE | Freq: Once | ORAL | Status: AC
Start: 2014-03-31 — End: 2014-03-31
  Administered 2014-03-31: 300 mg via ORAL
  Filled 2014-03-31: qty 2

## 2014-03-31 MED ORDER — CLINDAMYCIN HCL 300 MG PO CAPS
300.00 mg | ORAL_CAPSULE | Freq: Three times a day (TID) | ORAL | Status: AC
Start: 2014-03-31 — End: 2014-04-07

## 2014-03-31 MED ORDER — DOXYCYCLINE MONOHYDRATE 100 MG PO TABS
100.0000 mg | ORAL_TABLET | Freq: Once | ORAL | Status: DC
Start: 2014-03-31 — End: 2014-03-31

## 2014-03-31 NOTE — ED Triage Note (Signed)
Had a fish hook into his right hand yesterday which he took out himself. Now red/swollen and warm

## 2014-03-31 NOTE — Narrator Note (Signed)
Patient Disposition    Patient education for diagnosis, medications, activity, diet and follow-up.  Patient left ED 10:55 PM.  Patient rep received written instructions.  Interpreter to provide instructions: No    Have all existing LDAs been addressed? N/A    Have all IV infusions been stopped? N/A    Discharged to: Discharged to home

## 2014-03-31 NOTE — Discharge Instructions (Signed)
Index Illustration        Cellulitis   What is cellulitis?   Cellulitis is an infection of the skin and underlying tissue caused by streptococcal, staphylococcal, or other bacteria. This infection is serious and should receive immediate medical attention. Without treatment the infection can damage skin tissues and spread quickly through the bloodstream to the entire body. It could become life threatening.   Cellulitis is usually worse for if you have a lowered resistance to infection because of an illness or disorder such as HIV/AIDS, diabetes, or a weak immune system.   How does it occur?   Cellulitis can be caused by different types of bacteria. Bacteria enter the body through a cut or sore. Poisons produced by the bacteria destroy skin cells. The infection spreads over the area within a day or two and can affect tissues below the skin.   A particularly dangerous type of cellulitis can affect the eyes. It is called orbital cellulitis and can happen when bacteria get into tissues around the eye socket. Infected tissue swells around the eye, causing it to bulge out. This can trap and damage nerves. The infection can spread into the bloodstream or into the brain and cause life-threatening problems.   What are the symptoms?   Cellulitis most often occurs on the face, arms, or legs, but it can happen anywhere. Symptoms of cellulitis may include:   · redness   · swelling   · extreme tenderness or pain   · skin that feels hot to the touch   · red streaks from the wound or sore   · pus-filled sores (abscesses)   · swollen and tender lymph glands   · fever.   The symptoms of orbital cellulitis include:   · problems with your vision   · pain in and around the eye   · discharge from the eye   · fever   · swelling and redness of the eyelids and tissue around the eye   · an eye that looks like it bulges forward compared to the other eye.   How is it diagnosed?   Your healthcare provider will examine you. You may have  tests of your blood and discharge from sores.   How is it treated?   The infection is treated with an antibiotic. If you are taking an antibiotic by mouth, your provider will probably want to see you or talk to you 1 or 2 days after your first visit to make sure the antibiotic is working. If the infection does not get better with oral antibiotics, you may need to be treated at the hospital with intravenous (IV) antibiotics.   If you have a sinus infection that is causing orbital cellulitis, you may need surgery to drain the infection from your eye socket and sinuses.   How long will the effects of cellulitis last?   If treated right away with antibiotics, the infection usually clears up within 1 or 2 weeks.   Cellulitis that is not properly treated may lead to:   · infection in the blood   · gangrene (areas of body tissue destroyed) and possibly loss of a body part (amputation)   · meningitis if the infection spreads to the brain   · death.   Orbital cellulitis can cause a loss of vision.   How can I take care of myself?   · If you were prescribed an antibiotic, take all of it exactly as prescribed.   · Ask your   healthcare provider how to care for the infected area. For example, ask if you should put hot packs or dressings on the area.   · Sometimes the infection may get worse even though you are taking an antibiotic. Ask your provider what symptoms you should watch for and when you should check back with your provider.   · Call your provider right away if:   ¨ Your symptoms are getting worse.   ¨ You have new symptoms.   · Call your provider during office hours if:   ¨ Your symptoms are not starting to get better in 2 to 3 days.   How can I help prevent cellulitis?   · Clean cuts, scrapes, and other skin injuries well with soap and water.   · Keep wounds and sores clean and covered with a bandage. Change the bandage daily, or sooner if it gets dirty or wet.   · See your healthcare provider for treatment as soon as  possible if a wound or sore shows signs of infection.   · If you have diabetes, follow your instructions for good skin care and keep your blood sugar under good control.   Developed by [employer].   Published by [employer].   Last modified: 2008-09-25   Last reviewed: 2008-08-13   This content is reviewed periodically and is subject to change as new health information becomes available. The information is intended to inform and educate and is not a replacement for medical evaluation, advice, diagnosis or treatment by a healthcare professional.   Adult Health Advisor 2010.1 Index  © 2010 [employer] and/or its affiliates. All Rights Reserved.

## 2014-03-31 NOTE — ED Provider Notes (Signed)
I have reviewed the ED nursing notes and prior records. I have reviewed the patient's past medical history/problem list, allergies, social history and medication list.  I saw this patient primarily.    HPI:  This patient is a 38 year old male with no significant PMH who presents s/p injury to R hand. Mechanism of injury was fish hook. This fishhook was clean.  Stab himself with it on the dorsal aspect of his right hand near the base of the 1st MCP.  He removed the fishhook himself.  The fishhook came out completely.  He does not feel there is any foreign body there.  He has full range of motion of his thumb.  He hasn't noted some swelling around the area and some redness.  No fever.. No other injuries or somatic complaints.     ROS: Pertinent positives were reviewed as per the HPI above. All other systems were reviewed and are negative.    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 Patalano, MD       Medications:     No current facility-administered medications on file prior to encounter.  No current outpatient prescriptions on file prior to encounter.    Social History:     Smoking status: Never Smoker     Smokeless tobacco: Not on file    Alcohol Use: No       Allergies:  Review of Patient's Allergies indicates:   Nkda [please verify*        Physical Exam:  BP 122/85   Pulse 77   Temp(Src) 98.6 F   Resp 16   SpO2 98%  GENERAL:  Mildly anxious, otherwise in no distress  SKIN:  Warm & Dry, punctate lesion to  cm laceration to R hand, base of 1st MCP, mild redness and swelling surrounding 3x4 cm area, no fluctuance, not tender, no FB felt.   HEAD:  Atraumatic.  NECK:  Non tender  MUSCULOSKELETAL:  Normal cap refill. Normal strength, sensation to light touch and range  of motion of affected extremity. Full strength, no pain with flexion or extension of 1st digit.  NEUROLOGIC:  Alert & oriented x 3, Normal speech, Normal strength  PSYCHIATRIC:  Normal affect    ED Course and Medical Decision-making:    The patient is 38 year old male with a mild R hand cellulitis. No evidence of FB. Given Tdap, clinda, return precautions..     Reasons to return to the ED were reviewed in detail. The patient agrees with this plan and disposition.    Disposition:  Home    Diagnosis/Diagnoses:  Cellulitis  (primary encounter diagnosis)      Jerl SantosAlice K Dondi Burandt

## 2014-05-30 ENCOUNTER — Ambulatory Visit (HOSPITAL_BASED_OUTPATIENT_CLINIC_OR_DEPARTMENT_OTHER): Payer: Medicaid Other | Admitting: Specialist

## 2014-06-14 ENCOUNTER — Encounter (HOSPITAL_BASED_OUTPATIENT_CLINIC_OR_DEPARTMENT_OTHER): Payer: Self-pay

## 2014-06-14 ENCOUNTER — Emergency Department (HOSPITAL_BASED_OUTPATIENT_CLINIC_OR_DEPARTMENT_OTHER)
Admission: RE | Admit: 2014-06-14 | Disposition: A | Discharge status: Home / Self Care | Payer: Self-pay | Source: Emergency Department | Attending: Physician Assistant | Admitting: Physician Assistant

## 2014-06-14 LAB — XR CHEST 2 VIEWS

## 2014-06-14 MED ORDER — IBUPROFEN 800 MG PO TABS
800.0000 mg | ORAL_TABLET | Freq: Three times a day (TID) | ORAL | Status: AC | PRN
Start: 2014-06-14 — End: 2014-07-14

## 2014-06-14 MED ORDER — IBUPROFEN 800 MG PO TABS
800.00 mg | ORAL_TABLET | Freq: Once | ORAL | Status: AC
Start: 2014-06-14 — End: 2014-06-14
  Administered 2014-06-14: 800 mg via ORAL
  Filled 2014-06-14: qty 1

## 2014-06-14 NOTE — Narrator Note (Signed)
EKG:NS

## 2014-06-14 NOTE — Narrator Note (Signed)
Patient Disposition    Patient education for diagnosis, medications, activity, diet and follow-up.  Patient left ED 3:48 PM.  Patient rep received written instructions.  Interpreter to provide instructions: n/a  Patient belongings with patient:yes    Have all existing LDAs been addressed?yes  Have all IV infusions been stopped? N./a  Discharged tohome

## 2014-06-14 NOTE — ED Triage Note (Signed)
Lifted "heavy" kayak yesterday.  Has pain on deep inhalation left chest wall

## 2014-06-14 NOTE — ED Provider Notes (Signed)
eMERGENCY dEPARTMENT Physician Assistant NOTE    The ED nursing record was reviewed.   The prior medical records as available electronically through Epic were reviewed.  The mode of arrival was Relative on 06/14/2014  2:18 PM.    A [place] interpreter was used.    This patient was seen with Emergency Department attending physician Dr. Danelle Earthly    CHIEF COMPLAINT    Patient presents with:  Musculoskeletal Problem: CHEST PAIN      HPI    Devin Kemp is a 38 year old male who presents with right sided upper chest pain that started after lifting a kayak multiple times during the prior day.  Chest pain has occurred for approximately 16 hours.  Comes and goes.  Sharp.  5/10.  No radiation.  Has not taken any medication for the pain.  Worse when lying flat on his chest.  Worse with deep breathing.  Denies any history of this in the past.  Denies a history of hypertension, hyperlipidemia, diabetes.  Did report his father died early of a heart attack but states that was due to excessive alcohol abuse.  Pt denies HA, fever, dizziness, sore throat, stiff neck, cough, SOB,  Abd pain, N/V/D/C, changes in urination, urinary discharge, changes in stool, black/tarry stool, numbness, weakness, or calf pain, new lower extremity edema.    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 Nathaneil Canary, MD       CURRENT MEDICATIONS    No current outpatient prescriptions on file.    ALLERGIES    Review of Patient's Allergies indicates:   Nkda [please verify*        FAMILY HISTORY    No family history on file.    SOCIAL HISTORY    Social History    Marital Status: Married             Spouse Name:                       Years of Education:                 Number of children:                 Social History Main Topics    Smoking Status: Never Smoker                      Alcohol Use: No              Drug Use: No                REVIEW OF SYSTEMS     The pertinent positives are reviewed in the HPI above. All other systems were reviewed and are negative.    PHYSICAL EXAM      Vital Signs: BP 127/86 mmHg   Pulse 73   Temp(Src) 97.6 F   Resp 20   Wt 54.432 kg (120 lb)   SpO2 97%   Pain Score: 5 (5/10)   Constitutional: Well-developed, Well-nourished, Non-toxic appearance. Speaking full  sentences.  Distress: none  Eyes: PERRL, EOMI, conjunctiva pink and moist; no scleral injection or discharge; No hyphema, no hypopyon. No scleral icterus.   HENT: Atraumatic; no battle signs, no racoon eyes. No drainage from nares or ears.  Neck:  Normal range of motion, non-tender, Supple with no meningismus; no stridor.   Lymphatic: No lymphadenopathy noted.   Cardio.: RRR, No MRG's. Radial pulses 2+ B/L; No LE edema; Calves NTTP  Lungs: Normal BS's bilat., No tachypnea, wheezes, rales or rhonchi;  Non-labored w/o retractions or accessory muscle use, or tripoding  Abd. + BS throughout. Soft, NTND, No masses, rebound or guarding. No Murphy's, McBurney's, or Rovsing's.   Musculoskeletal:  Moving all 4 extremities, No major deformities noted. Ambulatory with steady gait.  Thorax: No spinal tenderness, thorax NTTP; No ecchymosis, edema or lesions;   Upper Extremities: w/o edema, erythema, deformity, crepitus, bony tenderness or clubbing B/L; NTTP, SAR 5/5 @ grip B/L. Sensation intact in all dermatomes, Cap Refill < 3s B/L;   Lower Extremities: w/o edema, erythema, deformity, or crepitus B/L; NTTP, SAR 5/5 @ ankles B/L. Sensation intact in all dermatomes. Dorsalis pedis 2+ b/l  Skin: Warm &dry, no rashes, diaphoresis or cellulitic changes.  Neurological: CNII-XII grossly intact, AOx3, No focal deficits noted. No abnormal or stereotyped movements.   Psychiatric: Appropriate for age and situation.        RESULTS  No results  found for this visit on 06/14/14 (from the past 24 hour(s)).     RADIOLOGY  CXR: Impression: No acute cardiopulmonary process.     EKG:  Normal sinus rhythm 60 beats per minute.  Normal axis.  PR interval the 146 milliseconds.  QRS of 84 milliseconds.  QTC is 378 milliseconds.  No evidence of acute ST or T-wave changes.    PROCEDURES      MEDICATIONS ADMINISTERED ON THIS VISIT  No orders of the defined types were placed in this encounter.       ED COURSE & MEDICAL DECISION MAKING      I reviewed the patient's past medical history/problem list, past surgical history, medication list, social history and allergies.    Arrival: Pt arrived in stable condition and required no immediate interventions.    ED Decision Making & Course: Pt is a 38 year old male presents with right-sided chest pain after lifting a kayak all day long.  He is afebrile and nontoxic.  Vital signs are stable.  Electrocardiogram is unremarkable without signs of ischemia, infarction, Q-T prolonged palpation or heart block.  Chest x-ray is unremarkable without signs of pneumonia, effusion, widening mediastinum or pneumothorax.  PERC negative. Given history this is most likely due to muscle strain.  Motrin for pain.  Heating pads.     Pt remained hemodynamically stable during their stay in the emergency department.     Follow Up:   The patient and/or family were educated on signs and symptoms for which they should return to the emergency department. Pt was discharged home. Pt and/or family understood and agreed with plan. They will follow up with PCP or specialist in timely matter.     CONDITION AT DISCHARGE   Stable and Improved    DISCHARGE DIAGNOSIS  Chest wall pain  (primary encounter diagnosis)      NEW PRESCRIPTIONS FROM THIS VISIT  Discharge Medication List as of 06/14/2014  3:39 PM    START taking these medications    ibuprofen (ADVIL,MOTRIN) 800 MG tablet  Take 1 tablet by mouth every 8 (  eight) hours as needed. painTamperproof, Disp-20 tablet,  R-0               Electronically signed by:   Vickey SagesJeffrey Holly Iannaccone, PAC, 06/14/2014 4:25 PM  Dept.of Emergency Medicine  [facility]

## 2014-06-21 LAB — EKG

## 2014-10-28 ENCOUNTER — Emergency Department (HOSPITAL_BASED_OUTPATIENT_CLINIC_OR_DEPARTMENT_OTHER)
Admission: RE | Admit: 2014-10-28 | Disposition: A | Discharge status: Home / Self Care | Payer: Self-pay | Source: Emergency Department | Attending: Emergency Medicine | Admitting: Emergency Medicine

## 2014-10-28 LAB — RAPID STREP (POINT OF CARE): STREP SCREEN: NEGATIVE

## 2014-10-28 NOTE — Discharge Instructions (Signed)
Return for any concern or worsening.  Rapid strep test does not show bacterial infection.  We will also complete a normal 24-hour throat culture.  If this grows bacteria, we will call you and notify you.  Ibuprofen 600-800 milligrams every 8 hours as needed.    Take this with food to protect your stomach, and stop taking it if it hurts your stomach.  Acetaminophen 1000 milligrams every 8 hours as needed.  Throat lozenges such as cough drops, sore throat drops, hard candy, or Cepacol lozenges as needed.

## 2014-10-28 NOTE — ED Triage Note (Signed)
Sore throat for 4 days. No fever. Motrin taken PTA

## 2014-10-28 NOTE — Narrator Note (Signed)
Patient Disposition    Patient education for diagnosis, medications, activity, diet and follow-up.  Patient left ED 8:30 PM.  Patient rep received written instructions.  Interpreter to provide instructions: No    Patient belongings with patient: YES    Have all existing LDAs been addressed? N/A    Have all IV infusions been stopped? N/A    Discharged to: Discharged to home

## 2014-10-28 NOTE — ED Provider Notes (Signed)
[facility] Emergency Medicine Attending Note     History of Present Illness:    Devin Kemp is a 39 year old male who came to the ED complaining of 4 days of sore throat, moderate, persists, worse with swallowing.  No fever, chills, sweating, voice change, pain with neck movement, nasal or sinus congestion, ear pain, headache, cough, or any other symptom.  No trauma to throat.  No improvement after taking tetracycline antibiotic, brought from [place].  Transient improvement with ibuprofen 400 milligrams.   Devin Kemp   MRN: [id number]  PCP: Kathryne SharperJohn Brusch,MD    Arrived to the ED by: Self    History provided by: the patient   Other: Reviewed nursing notes      Review of Systems:  As per HPI. All other systems were reviewed and are negative        Past Medical Hx:    Past Medical History    NO KNOWN PROBLEMS     Meds:    No current facility-administered medications for this encounter.  No current outpatient prescriptions on file.      Past Surgical Hx:      Past Surgical History    CATARACT REMOVAL INSERTION OF LENS  07/15/2005    Comment RIGHT EYE - [id number] Jari Favre-  Steven M. Nathaneil CanaryPatalano, MD    Allergies:  Review of Patient's Allergies indicates:   Nkda [please verify*       Social Hx:     Smoking status: Never Smoker     Smokeless tobacco: Not on file    Alcohol Use: No    Immunizations:  Immunization History   Administered Date(s) Administered    Flu Vaccine > 3 Yrs 07/10/2005    Influenza Virus Quad Presv Free Vacc 3/> Yrs IM 06/15/2013    Td 07/10/2005    Tdap 03/31/2014        Physical Examination:    ED Triage Vitals   Enc Vitals Group      BP --       Pulse --       Resp --       Temp --       Temp src --       SpO2 --       Weight --       Height --       Head Cir --       Peak Flow --       Pain Score --       Pain Loc --       Pain Edu? --       Excl. in GC? --         General: Patient is well appearing in no acute distress, cooperative with exam    Eyes: PERRL, EOMI, no conjunctival injection    Head, ears, nose, and  throat: Normocephalic and atraumatic. Moist mucous membranes. Normal phonation.  Posterior oropharyngeal erythema without edema or exudate.  On slurred and peritonsillar erythema without edema or exudate.  No ulcerative lesions.  No nasal congestion.  No cough.    Neck: Supple with full ROM.  No pain with laryngeal movement/rock.    Respiratory/chest: No respiratory distress, speaks in full sentences.    Skin: Warm and well perfused, no rashes or erythema/ecchymosis.    Neurologic: Alert and oriented x 3, no focal deficits.    Medications Given in the ED:    Medications - No data to display Radiology and ECG:  No orders to display        Lab Results:    Labs Reviewed   THROAT CULTURE, BETA STREP    Narrative:     Source Details->Throat Culture   RAPID STREP (POINT OF CARE)    Vital Signs:     10/28/14  1905   BP: 121/79   Pulse: 75   Temp: 98.4 F   Resp: 20   Weight: 54.432 kg (120 lb)   SpO2: 100%          ED Course and Medical Decision Making:    I offered this patient acetaminophen, which he declined.  Rapid strep: negative  Throat cx pending.    Likely viral pharyngitis.  Symptomatically care.    Patient/family educated on their diagnosis, he verbalizes understanding and agrees with plan of care. He was told to follow up with his primary care physician. I reviewed with him reasons to return to the Emergency Department, all questions were answered.    ED Disposition:     Impression(s):  Pharyngitis  (primary encounter diagnosis)    Disposition:  Home    Signed by Dr. Reece Leader, MD, [facility]  Emergency Medicine Attending  [facility]

## 2014-10-31 LAB — THROAT CULTURE BETA STREP

## 2017-06-20 ENCOUNTER — Emergency Department (HOSPITAL_BASED_OUTPATIENT_CLINIC_OR_DEPARTMENT_OTHER)
Admission: RE | Admit: 2017-06-20 | Disposition: A | Discharge status: Home / Self Care | Payer: Self-pay | Source: Emergency Department | Attending: Emergency Medicine | Admitting: Emergency Medicine

## 2017-06-20 ENCOUNTER — Encounter (HOSPITAL_BASED_OUTPATIENT_CLINIC_OR_DEPARTMENT_OTHER): Payer: Self-pay | Admitting: Emergency Medicine

## 2017-06-20 LAB — RAPID STREP (POINT OF CARE): STREP SCREEN: NEGATIVE

## 2017-06-20 MED ORDER — IBUPROFEN 600 MG PO TABS
600.0000 mg | ORAL_TABLET | Freq: Three times a day (TID) | ORAL | 0 refills | Status: AC | PRN
Start: 2017-06-20 — End: 2017-06-30

## 2017-06-20 MED ORDER — ACETAMINOPHEN 325 MG PO TABS
650.00 mg | ORAL_TABLET | Freq: Once | ORAL | Status: AC
Start: 2017-06-20 — End: 2017-06-20
  Administered 2017-06-20: 650 mg via ORAL
  Filled 2017-06-20: qty 2

## 2017-06-20 MED ORDER — IBUPROFEN 600 MG PO TABS: 600 mg | tablet | Freq: Three times a day (TID) | ORAL | 0 refills | 0 days | Status: AC | PRN

## 2017-06-20 NOTE — Narrator Note (Signed)
Patient Disposition    Patient education for diagnosis, medications, activity, diet and follow-up.  Patient left ED 7:50 AM.  Patient rep received written instructions.  Interpreter to provide instructions: No    Patient belongings with patient: YES    Have all existing LDAs been addressed? N/A    Have all IV infusions been stopped? N/A    Discharged to: Discharged to home, ambulatory with F/U advised and clarified.

## 2017-06-20 NOTE — ED Triage Note (Signed)
Pt c/o 3 days of sore throat and intermittent fevers.  H/A today.  Red and tender throat.

## 2017-06-20 NOTE — ED Provider Notes (Signed)
I have reviewed the ED nursing notes and prior records. I have reviewed the patient's past medical history/problem list, allergies, social history and medication list.  I saw this patient primarily.    HPI:  This 41 year old male patient presents with chief complaint of sore throat, fever and headache for 3 days.  Denies any cough or shortness of breath.  No nasal congestion.    ROS: Pertinent positives were reviewed as per the HPI above. All other systems were reviewed and are negative.    Past Medical History/Problem List:  Past Medical History:  No date: NO KNOWN PROBLEMS  Patient Active Problem List:     Pseudophakia     After cataract      Past Surgical History:  Past Surgical History:  07/15/2005: CATARACT REMOVAL INSERTION OF LENS      Comment:  RIGHT EYE - [id number] Jari Favre-  Steven M. Patalano, MD    Medications:     No current facility-administered medications on file prior to encounter.   No current outpatient medications on file prior to encounter.    Social History:  Social History    Tobacco Use      Smoking status: Never Smoker      Smokeless tobacco: Never Used    Alcohol use: No      Allergies:  Review of Patient's Allergies indicates:   Nkda [please verify*        Physical Exam:  BP 129/80  Pulse 105  Temp 98.9 F  Resp 20  Wt 59 kg (130 lb 1.1 oz)  SpO2 100%  GENERAL:  Well-appearing, no distress.  SKIN:  Warm & Dry, no rash, no bruising.  HEAD:  Atraumatic. PERRL, funduscopic exam unremarkable. Anicteric sclera.  Tympanic membranes clear bilaterally. Oropharynx with erythema, no edema, no exudate  NECK:  Supple, no lymphadenopathy.  LUNGS:  Clear to auscultation bilaterally without rales, rhonchi or wheezing.  No chest wall tenderness.  HEART:  RRR.  No murmurs, rubs, or gallops.   ABDOMEN:  Soft, flat, without distension.  Nontender to palpation.  Bowel sounds positive.  MUSCULOSKELETAL:  No deformities. Well-perfused extremities with  2+ DP/PT/Rad pulses bilaterally. No cyanosis or edema.  GENITOURINARY:   No CVA tenderness.   NEUROLOGIC:  Normal speech.  alert & oriented x 3, CNsII-XII intact. Gait normal.  PSYCHIATRIC:  Normal affect    ED Course and Medical Decision-making:    Results for orders placed or performed during the hospital encounter of 06/20/17 (from the past 24 hour(s))   Throat Culture Beta Strep    Collection Time: 06/20/17  7:39 AM    Narrative    Source Details->throat   Rapid Strep (Point of Care)    Collection Time: 06/20/17  7:39 AM   Result Value    STREP SCREEN Negative    ONBOARD CONTROL PRESENT? Yes         The patient is 41 year old male with acute viral pharyngitis.    Patient was given instructions for viral pharyngitis and prescription for Motrin.  He was advised to follow-up with primary care physician as needed.    Reasons to return to the ED were reviewed in detail. The patient agrees with this plan and disposition.    Disposition: Discharged    Condition on  Discharge:  Improved and Stable    Diagnosis/Diagnoses:  Acute viral pharyngitis      Aileen PilotSlava V. Romaine Maciolek, MD      This record was generated in real time  using voice recognition software. I proof-read and corrected any voice recognitions errors found. Please excuse any remaining voice recognition errors which were not detected.

## 2017-06-22 LAB — THROAT CULTURE BETA STREP

## 2017-06-23 NOTE — Progress Notes (Signed)
Spoke with patient, sore throat improving but persists. Would like antibiotics. Prescribed amoxicillin 500 mg bid x 10 days. Called in to [facility] on [address]. F/u with PCP prn if no improvement in next couple days. Patient given care, follow up and return precautions.

## 2019-07-27 ENCOUNTER — Emergency Department
Admission: EM | Admit: 2019-07-27 | Discharge: 2019-07-27 | Disposition: A | Discharge status: Home / Self Care | Payer: No Typology Code available for payment source | Source: Intra-hospital | Attending: Physician Assistant | Admitting: Physician Assistant

## 2019-07-27 ENCOUNTER — Encounter (HOSPITAL_BASED_OUTPATIENT_CLINIC_OR_DEPARTMENT_OTHER): Payer: Self-pay

## 2019-07-27 ENCOUNTER — Emergency Department: Payer: No Typology Code available for payment source | Attending: Physician Assistant

## 2019-07-27 ENCOUNTER — Other Ambulatory Visit: Payer: Self-pay

## 2019-07-27 DIAGNOSIS — Z23 Encounter for immunization: Secondary | ICD-10-CM | POA: Diagnosis not present

## 2019-07-27 DIAGNOSIS — S62633B Displaced fracture of distal phalanx of left middle finger, initial encounter for open fracture: Secondary | ICD-10-CM | POA: Diagnosis present

## 2019-07-27 DIAGNOSIS — S60142A Contusion of left ring finger with damage to nail, initial encounter: Secondary | ICD-10-CM | POA: Diagnosis not present

## 2019-07-27 DIAGNOSIS — S62633A Displaced fracture of distal phalanx of left middle finger, initial encounter for closed fracture: Secondary | ICD-10-CM | POA: Diagnosis not present

## 2019-07-27 DIAGNOSIS — W293XXA Contact with powered garden and outdoor hand tools and machinery, initial encounter: Secondary | ICD-10-CM | POA: Diagnosis not present

## 2019-07-27 DIAGNOSIS — S62665A Nondisplaced fracture of distal phalanx of left ring finger, initial encounter for closed fracture: Secondary | ICD-10-CM | POA: Diagnosis not present

## 2019-07-27 DIAGNOSIS — S61213A Laceration without foreign body of left middle finger without damage to nail, initial encounter: Secondary | ICD-10-CM

## 2019-07-27 MED ORDER — ACETAMINOPHEN 500 MG PO TABS
1000.0000 mg | ORAL_TABLET | Freq: Once | ORAL | Status: AC
Start: 2019-07-27 — End: 2019-07-27
  Administered 2019-07-27: 1000 mg via ORAL
  Filled 2019-07-27: qty 2

## 2019-07-27 MED ORDER — CEPHALEXIN 500 MG PO CAPS
500.00 mg | ORAL_CAPSULE | Freq: Once | ORAL | Status: AC
Start: 2019-07-27 — End: 2019-07-27
  Administered 2019-07-27: 500 mg via ORAL
  Filled 2019-07-27: qty 1

## 2019-07-27 MED ORDER — CEPHALEXIN 500 MG PO CAPS
500.00 mg | ORAL_CAPSULE | Freq: Four times a day (QID) | ORAL | 0 refills | Status: AC
Start: 2019-07-27 — End: 2019-08-01

## 2019-07-27 MED ORDER — LIDOCAINE HCL 1 % IJ SOLN
5.0000 mL | Freq: Once | INTRAMUSCULAR | Status: DC
Start: 2019-07-27 — End: 2019-07-27

## 2019-07-27 MED ORDER — IBUPROFEN 600 MG PO TABS
600.00 mg | ORAL_TABLET | Freq: Three times a day (TID) | ORAL | 0 refills | Status: AC | PRN
Start: 2019-07-27 — End: 2019-08-03

## 2019-07-27 MED ORDER — TETANUS-DIPHTHERIA TOXOIDS TD 5-2 LFU IM INJ
0.5000 mL | INJECTION | Freq: Once | INTRAMUSCULAR | Status: AC
Start: 2019-07-27 — End: 2019-07-27
  Administered 2019-07-27: 0.5 mL via INTRAMUSCULAR
  Filled 2019-07-27: qty 0.5

## 2019-07-27 NOTE — ED Triage Note (Signed)
Patient present with L hand middle finger laceration. Patient got hand run over by snow blower at 6am. Ring and index finger bruised and swollen. Bloody but clotted middle finger with bandaid, Claire PA waiting to look at it until after XR. 4/10 pain. Took ibuprofen 3 hours ago.

## 2019-07-27 NOTE — Narrator Note (Signed)
Patient Disposition  Patient education for diagnosis, medications, activity, diet and follow-up.  Patient left ED 4:50 PM.  Patient rep received written instructions.    Interpreter to provide instructions: No    Patient belongings with patient: YES    Have all existing LDAs been addressed? N/A    Have all IV infusions been stopped? N/A    Destination: Discharged to home

## 2019-07-27 NOTE — Discharge Instructions (Signed)
Keep bandage on for the next 48 hours, then can change, but continue to wear splint.  Follow-up with orthopedist for close follow-up.  Sutures need to be removed in 7 to 10 days.  Take entire course of antibiotic to prevent infection, injury was high risk.  For pain management ice elevate, wear splint to not bend or move, alternate Tylenol and ibuprofen.  Return to the emergency room for severe pain, signs of infection, any other concerns.

## 2019-07-27 NOTE — ED Provider Notes (Signed)
EMERGENCY DEPARTMENT PHYSICIAN ASSISTANT NOTE      The ED nursing record was reviewed.   The prior medical records as available electronically through Epic were reviewed.    CHIEF COMPLAINT    Patient presents with:  Laceration: L hand      HPI    Devin Kemp is a 43 year old male no significant past medical history, who comes to the ED due to left third digit laceration.  Around 6 AM this morning patient was outside snowblowing when it got stuck.  Patient told his wife to hold onto the brake, he reached into the machine and his left second, third and fourth digits were cut on the moving blade.  Patient applied pressure, continue to have a bleeding so he came to the emergency room for evaluation.  Patient states that he was having significant throbbing pain, improved after ibuprofen.  Patient states that his last tetanus vaccine was more than 10 years ago.  Patient is right-hand dominant.    REVIEW OF SYSTEMS    General: no fever  EENT: no sore throat  Respiratory: no cough  Cardiac: no chest pain  Musculoskeletal: left finger laceration  Skin: laceration  Psych: no acute complaints, feels safe, denies substance abuse  Neuro: no headache, numbness     PAST MEDICAL HISTORY    Past Medical History:  No date: NO KNOWN PROBLEMS    PROBLEM LIST  Patient Active Problem List:     Pseudophakia     After cataract      SURGICAL HISTORY    Past Surgical History:  07/15/2005: CATARACT REMOVAL INSERTION OF LENS      Comment:  RIGHT EYE - [id number] Wenda Low. Patalano, MD    CURRENT MEDICATIONS      Current Facility-Administered Medications:     lidocaine 1 % injection 5 mL, 5 mL, Intradermal, Once, Clint Guy, PA-C    Current Outpatient Medications:     cephALEXin (KEFLEX) 500 MG capsule, Take 1 capsule by mouth 4 (four) times daily  for 5 days, Disp: 20 capsule, Rfl: 0    ibuprofen (ADVIL) 600 MG tablet, Take 1 tablet by mouth every 8 (eight) hours as needed pain  for up to 7 days, Disp: 20 tablet, Rfl:  0    ALLERGIES    Review of Patient's Allergies indicates:   Nkda [please verify*        FAMILY HISTORY    History reviewed.  No pertinent family history.      SOCIAL HISTORY    Social History     Socioeconomic History    Marital status: Married     Spouse name: Not on file    Number of children: Not on file    Years of education: Not on file    Highest education level: Not on file   Occupational History    Not on file   Social Needs    Financial resource strain: Not on file    Food insecurity     Worry: Not on file     Inability: Not on file    Transportation needs     Medical: Not on file     Non-medical: Not on file   Tobacco Use    Smoking status: Never Smoker    Smokeless tobacco: Never Used   Substance and Sexual Activity    Alcohol use: No    Drug use: No    Sexual activity: Not on file   Lifestyle  Physical activity     Days per week: Not on file     Minutes per session: Not on file    Stress: Not on file   Relationships    Social connections     Talks on phone: Not on file     Gets together: Not on file     Attends religious service: Not on file     Active member of club or organization: Not on file     Attends meetings of clubs or organizations: Not on file     Relationship status: Not on file    Intimate partner violence     Fear of current or ex partner: Not on file     Emotionally abused: Not on file     Physically abused: Not on file     Forced sexual activity: Not on file   Other Topics Concern    Not on file   Social History Narrative    Not on file       PHYSICAL EXAM      Vital Signs: BP 124/94    Pulse 90    Temp 99.6 F    Resp 14    Wt 68 kg (150 lb)    SpO2 99%      Constitutional: Well-developed, Well-nourished, Non-toxic appearance. Speaking full sentences.  Head: Normocephalic   Eyes: Vision grossly intact.  ENT: Mucous membranes moist.  Hearing grossly intact.    Neck: Normal range of motion  Pulmonary: nonlabored  Musculoskeletal:  Left hand:   second digit: minimal  distal tenderness with mild edema  Third digit: Distal tip has multiple linear lacerations with maceration of remaining tissue of finger pad.  Nailbed intact.  Full flexion and extension at DIP and PIP.  Localized tenderness.  Fourth digit: 75% of nail has subungual hematoma with significant distal tuft edema.  Full flexion-extension of DIP and PIP.  Localized tenderness, sensation intact  Neurological: CNII-XII grossly intact, AOx3, No focal deficits noted.   Psychiatric: Appropriate for age and situation.  Skin: Warm, dry      RESULTS  No results found for this visit on 07/27/19 (from the past 24 hour(s)).   RADIOLOGY  Hand Xray: Comminuted distal tuft fracture of left third digit, fracture of distal tuft of fourth digit      PROCEDURES   digital block of left middle finger: Area was cleaned with alcohol, lidocaine 1% 3 mL total applied to medial lateral aspect with sufficient anesthetic result.    Laceration repair: Macerated multiple laceration to finger pad of left middle finger no distal phalanx.  Nailbed intact.  Area was cleaned and irrigated with normal saline, remaining 2 mL of 1% lidocaine locally injected with sufficient anesthetic relief.  Total of 8 5-0 nonabsorbable suture material placed.     Nail trepination: No cautery pending in the department, supply room or in orthopedist office.  Indication greater than 75% of nail has subungual hematoma.  Area cleaned with alcohol, using 18 guauge needle, small puncture wound to nail made with immediate return of blood.  Patient had instantaneous relief.  Tolerated procedure well    MEDICATIONS ADMINISTERED ON THIS VISIT  Orders Placed This Encounter      acetaminophen (TYLENOL) tablet 1,000 mg      tetanus & diphtheria toxoids (adult) injection 0.5 mL      lidocaine 1 % injection 5 mL      cephALEXin (KEFLEX) capsule 500 mg  Order Specific Question: Specify indication:          Answer: Infection - organism unknown          Order Specific Question:  Indicate type of infection:          Answer: Cellulitis - Skin/Soft Tissue          Order Specific Question: Indicate type of therapy:          Answer: New antimicrobial therapy      cephALEXin (KEFLEX) 500 MG capsule          Sig: Take 1 capsule by mouth 4 (four) times daily  for 5 days          Dispense:  20 capsule          Refill:  0      ibuprofen (ADVIL) 600 MG tablet          Sig: Take 1 tablet by mouth every 8 (eight) hours as needed pain  for up to 7 days          Dispense:  20 tablet          Refill:  0      ED COURSE & MEDICAL DECISION MAKING      I reviewed the patient's past medical history/problem list, past surgical history, medication list, social history and allergies.    ED Decision Making & Course: Patient is a 43 year old male, no significant past medical history, who comes to the ED due to left index, middle and ring finger laceration sustained on snowblower 6am today.  Patient took ibuprofen prior to arrival, treated with Tylenol in the department with sufficient pain relief.  Index finger has localized edema but no obvious fracture or open wound.  Middle finger has significant distal phalanx laceration and open fracture, nailbed intact.  Digital block and wound repair with 8 5-0 nonabsorbable sutures.  Fourth digit has greater than 75% subungual hematoma, trepanation performed with sufficient relief.  Third and fourth digit were splinted in resting position.  Due to open fracture, prophylactic antibiotics are scribed, patient received first dose while in the department.  Referral placed for Ortho, information also given to patient for close follow-up.  Instructed to take entire course of antibiotics as prescribed, rest ice elevate, wear splint as applied and limit movement.  Instructed to follow-up for increasing pain, signs of infection or any other concerns.  Patient remained hemodynamically stable during their stay in the emergency department.     Diagnosis: third digit open fracture with  laceration repair, fourth digit fracture with nail trepination, contusion second digit  Condition: stable  Disposition: home    Follow Up: Ortho, referral placed

## 2019-07-28 ENCOUNTER — Ambulatory Visit: Payer: No Typology Code available for payment source | Admitting: Rehabilitative and Restorative Service Providers"

## 2019-07-28 ENCOUNTER — Encounter (HOSPITAL_BASED_OUTPATIENT_CLINIC_OR_DEPARTMENT_OTHER): Payer: Self-pay | Admitting: Hand Surgery

## 2019-07-28 ENCOUNTER — Ambulatory Visit: Payer: No Typology Code available for payment source | Attending: Physician Assistant | Admitting: Hand Surgery

## 2019-07-28 ENCOUNTER — Other Ambulatory Visit: Payer: Self-pay

## 2019-07-28 VITALS — BP 127/80 | HR 72 | Temp 97.8°F | Resp 18

## 2019-07-28 DIAGNOSIS — S62633B Displaced fracture of distal phalanx of left middle finger, initial encounter for open fracture: Secondary | ICD-10-CM | POA: Diagnosis present

## 2019-07-28 NOTE — Progress Notes (Signed)
07/28/19    This patient was seen in the walk in clinic with dx of left MF P3 fx as well as a RF tuft fx. A mallet type splint was fabriated for the right RF and a gutter splint was fabricated for the left MF over his wound dressing. He was educated on the wear and care schedules and reports comfort.    Talmadge Coventry, [place], Lic # [id number]

## 2019-07-28 NOTE — Progress Notes (Signed)
Left middle finger open comminuted distal phalanx fracture from snowblower injury. FDP tendon is intact, nail plate is intact, no deformity. Will return in two weeks for suture removal. Was told to start antibiotics today.   Please see the full note of Katrina Azzie Glatter, PA-C.      MD ATTESTATION  I, Dr. Fredderick Severance, have seen and examined this patient on the date of service, confirmed key exam findings, reviewed any pertinent studies and images, and formulated the plan of care. I agree with the note as outlined above.    Estrella Deeds, MD, 07/28/2019

## 2019-07-28 NOTE — Progress Notes (Signed)
New patient      This patient was seen in consultation at the request of the ED  PCP: Kathrynn Running, MD  Hand Dominance: right  Date of injury: 07/27/19-snow blower    DX:   1. left middle finger open comminuted distal phalanx fracture  2. Left ring finger tuft fracture      HPI:   -This is a 43 year old male pt presenting today for evaluation of left hand injury while snowblowing.  He was seen in the emergency department yesterday for evaluation of left hand injury.  He suffered an open distal phalanx fracture of the left middle finger and tuft fracture of the ring finger.  The wound was irrigated and closed in the emergency department.  He was given Keflex as a prophylactic antibiotic and referred to orthopedics.  He states he has not filled the antibiotic prescription yet.  He does have pain and decreased range of motion secondary to pain and swelling.    Occupation: iron worker    Review of Systems:   Gen- Negative for fevers, chills, recent illnesses, night sweats, weight loss.  Skin- Negative for rashes, hair loss  CV- Negative for chest pain, palpatations.  Pulm- Negative for SOB, wheezing  GI- Negative for abdominal pain, N/V, diarrhea, bloody stool, ulcers, constipation.  GU- Negative for hematuria, dysuria, frequency or feeling of incomplete empyting.  Hematologic - Negative for easy bleeding/bruising, pallor and lymphadenopathy  MSK- See HPI for pertinent positives.  Psych- Negative for depression, anxiety, history of dependency.  Neuro - Negative for seizures, headache, weakness, dizziness, tremor, confusion    Allergies and medications: Reviewed and updated in Epic.    Current Outpatient Medications:   .  cephALEXin (KEFLEX) 500 MG capsule, Take 1 capsule by mouth 4 (four) times daily  for 5 days, Disp: 20 capsule, Rfl: 0  .  ibuprofen (ADVIL) 600 MG tablet, Take 1 tablet by mouth every 8 (eight) hours as needed pain  for up to 7 days, Disp: 20 tablet, Rfl: 0  No current facility-administered medications  for this visit.   Review of Patient's Allergies indicates:   [employer] verify*        PMHx: Past Medical History:  No date: NO KNOWN PROBLEMS  Reviewed with patient    Surgical Hx: Past Surgical History:  07/15/2005: CATARACT REMOVAL INSERTION OF LENS      Comment:  RIGHT EYE - [id number] Wenda Low. Lonia Chimera, MD  Reviewed with patient    Family Hx: No family history on file.    Reviewed with patient    Social Hx:   Social History     Socioeconomic History   . Marital status: Married     Spouse name: Not on file   . Number of children: Not on file   . Years of education: Not on file   . Highest education level: Not on file   Occupational History   . Not on file   Social Needs   . Financial resource strain: Not on file   . Food insecurity     Worry: Not on file     Inability: Not on file   . Transportation needs     Medical: Not on file     Non-medical: Not on file   Tobacco Use   . Smoking status: Never Smoker   . Smokeless tobacco: Never Used   Substance and Sexual Activity   . Alcohol use: No   . Drug use: No   .  Sexual activity: Not on file   Lifestyle   . Physical activity     Days per week: Not on file     Minutes per session: Not on file   . Stress: Not on file   Relationships   . Social Wellsite geologist on phone: Not on file     Gets together: Not on file     Attends religious service: Not on file     Active member of club or organization: Not on file     Attends meetings of clubs or organizations: Not on file     Relationship status: Not on file   . Intimate partner violence     Fear of current or ex partner: Not on file     Emotionally abused: Not on file     Physically abused: Not on file     Forced sexual activity: Not on file   Other Topics Concern   . Not on file   Social History Narrative   . Not on file     Reviewed with patient    Physical exam:  No repeat blood pressure data filed.  No orthostatic vitals data filed.  No peak flow data filed.  No pain information filed.  General/neuro: Alert and  oriented, no acute distress, cooperative  HEENT: PERRLA, EOM intact, sclerae anicteric  Neck: no noticeable or palpable swelling, redness or rash around throat or on face  Cardiac: RRR  Pulm: breathing comfortably on room air  Skin: warm and dry without rashes, lesions or abrasions  Musculoskeletal:   -Exam of the left upper extremity reveals left middle and ring finger to be largely swollen.  The left middle finger pad has a C-shaped cut, diffuse swelling, interrupted sutures placed in the laceration.  He does have flexion intact at the DIP joint of both fingers.  The ring finger has subungual hematoma, less than 50% affected, swollen and ecchymotic, also able to flex at the DIP joint  -No abnormality in finger alignment  -Fingernail plate intact  -Median, radial and ulnar nerves intact, both motor and sensory.  -Radial pulse 2+.    Imaging: X-rays of the left hand reveal comminuted distal phalanx fracture of the middle finger and a tuft fracture of the ring finger  Radiographs were visualized and interpreted by both myself and Dr. Olam Idler.    Assessment/plan:  -Left hand injury due to snowblower accident 07/27/2019.  He suffered an open distal phalanx fracture of the middle finger, and a tuft fracture to the ring finger with associated subungual hematoma.  -We recommend picking up antibx right away.  This may help prevent a wound and possible bone infection.  I explained to him if he were to have a finger infection this could cause not only tissue loss but bone loss as well.  -Middle finger dressing applied and to stay in place until follow up in two weeks  -no work for two weeks  -OT to make splint today for middle finger for full time use, and ring finger splint for intermittent splinting as needed  -Patient will follow up in 2 weeks for wound check and suture removal    Patient was seen, discussed and examined with Dr. Olam Idler who formulated the assessment and plan. Please see her dictated note as well.  Paulita Fujita, PA-C, 07/28/2019

## 2019-08-01 ENCOUNTER — Ambulatory Visit (HOSPITAL_BASED_OUTPATIENT_CLINIC_OR_DEPARTMENT_OTHER): Payer: Self-pay | Admitting: Rehabilitative and Restorative Service Providers"

## 2019-08-10 ENCOUNTER — Other Ambulatory Visit: Payer: Self-pay

## 2019-08-10 ENCOUNTER — Telehealth (HOSPITAL_BASED_OUTPATIENT_CLINIC_OR_DEPARTMENT_OTHER): Payer: Self-pay | Admitting: Ambulatory Care

## 2019-08-10 ENCOUNTER — Telehealth (HOSPITAL_BASED_OUTPATIENT_CLINIC_OR_DEPARTMENT_OTHER): Payer: Self-pay

## 2019-08-10 ENCOUNTER — Ambulatory Visit (HOSPITAL_BASED_OUTPATIENT_CLINIC_OR_DEPARTMENT_OTHER): Payer: No Typology Code available for payment source | Admitting: Hand Surgery

## 2019-08-10 NOTE — Telephone Encounter (Signed)
Jerlyn Ly, RN  Debra A. Joylene John, MD            Hi Dr Joylene John     Patient has sutures from ED visit 12/17--please advise     Thank you   Bekah Igoe    Previous Messages    ----- Message -----   From: Candis Schatz, LPN   Sent: [id number]/7371  0:62 PM EST   To: Candis Schatz, LPN, Lisabeth Pick, *   Subject: appt dr Rosana Fret,   This patient called the call center today saying he was positive with covid and had to cancel. I spoke to [facility] who advised patient be reschedule for follow up Jan.19th, can you please contact them to arrange.   Thank you very much!   Elmo Putt

## 2019-08-10 NOTE — Telephone Encounter (Signed)
Returned patient call with port int [phone]  Patient reports he tested positive for covid 12/29  Patient schedule to come see Dr Joylene John 08/29/19  Per Dr Joylene John the sutures are safe to remain in place until seen in the office.  Patient did state that he feels more than comfortable to remove the sutures himself at home. Dr Joylene John okay with this plan. Patient advised to call back the office with any issues after removing sutures self at home. He will keep appt on 1/19 as planned

## 2019-08-10 NOTE — Telephone Encounter (Signed)
Called pt, unable to reach, left msg to call [phone] and speak to one of the nurses.

## 2019-08-10 NOTE — Telephone Encounter (Signed)
Patient's language of care [place]      Name of caller: Azim    Relationship to patient: Self    Contact phone [phone]      Reason for call had a test done outside [facility] tested positive yesterday  Has some questions      Has the patient called or spoken to someone about this issue before

## 2019-08-29 ENCOUNTER — Encounter (HOSPITAL_BASED_OUTPATIENT_CLINIC_OR_DEPARTMENT_OTHER): Payer: Self-pay | Admitting: Hand Surgery

## 2019-08-29 ENCOUNTER — Ambulatory Visit (HOSPITAL_BASED_OUTPATIENT_CLINIC_OR_DEPARTMENT_OTHER): Payer: No Typology Code available for payment source | Admitting: Hand Surgery

## 2019-08-29 ENCOUNTER — Ambulatory Visit
Admission: RE | Admit: 2019-08-29 | Discharge: 2019-08-29 | Disposition: A | Discharge status: Home / Self Care | Payer: No Typology Code available for payment source | Attending: Hand Surgery | Admitting: Hand Surgery

## 2019-08-29 ENCOUNTER — Other Ambulatory Visit: Payer: Self-pay

## 2019-08-29 DIAGNOSIS — S62633D Displaced fracture of distal phalanx of left middle finger, subsequent encounter for fracture with routine healing: Secondary | ICD-10-CM | POA: Diagnosis present

## 2019-08-29 DIAGNOSIS — S62633B Displaced fracture of distal phalanx of left middle finger, initial encounter for open fracture: Secondary | ICD-10-CM | POA: Diagnosis not present

## 2019-08-29 DIAGNOSIS — S62665A Nondisplaced fracture of distal phalanx of left ring finger, initial encounter for closed fracture: Secondary | ICD-10-CM

## 2019-08-29 DIAGNOSIS — S62633A Displaced fracture of distal phalanx of left middle finger, initial encounter for closed fracture: Secondary | ICD-10-CM

## 2019-08-29 NOTE — Progress Notes (Signed)
He is 4 weeks after the left middle finger crush injury with an open comminuted distal phalanx fracture and a left ring finger tuft fracture.  He has a lot of swelling still at the tip of the left middle finger.  I think that this will settle down over time.  We will can have him come back to see [place] in about another month and given him some Coban wrap and I just want to make sure this is continuing to get better.  Please see the full note of Katrina Encarnacion Chu, PA-C.      MD ATTESTATION  I, Dr. Michela Pitcher, have seen and examined this patient on the date of service, confirmed key exam findings, reviewed any pertinent studies and images, and formulated the plan of care. I agree with the note as outlined above.    Resa Miner, MD, 08/29/2019

## 2019-08-29 NOTE — Progress Notes (Signed)
FOLLOW UP NOTE    PCP: Ardelia Mems, MD  Hand dominance: right  DOI: 07/27/19=snow blower      DX:   1. left middle finger open comminuted distal phalanx fracture  2. Left ring finger tuft fracture    Interim History:   -This is a 44 year old male pt presenting today for follow up 4 weeks post injury. He developed COVID-19 after our last visit and was unable to see [place] 2 weeks post injury as was suggested. He removed his own sutures at home. He denies pain and is healing well he feels. He does have questions about the pad of the middle finger and if the swelling will resolve.     Occupation: iron worker  Meds & Allergies: Reviewed and updated in epic.    Physical Exam:  General: Well appearing 45yo male pt in NAD, alert and oriented, cooperative  HEENT: NCAT, EOMI, sclerae anicteric bilaterally  Upper extremity:  -left middle finger with enlarged pad of finger, with associated redness, nontender to palpation  -ROM of middle and ring finger DIP intact  -left ring finger  Subungual hematoma, nontender pad of finger  -Neurovascular status: intact  -Skin is warm and dry without rashes, lesions or abrasions  -Capillary refill is brisk  -Radial pulses: 2+ bilaterally    Imaging (x-ray): new Radiographs were visualized and interpreted by both myself and Dr. Olam Idler. Left middle finger distal phalanx comminuted fracture, healing well    Assessment/plan:   -Left middle finger distal phalanx comminuted fracture, open type, healing well but with significant swelling of the pad of the finger.  We have encouraged him over time this should continue to resolve.   -d/c splinting and use coban wrap to help with swelling.  We will see him back for follow-up in a few weeks in a [facility] clinic.      Patient was seen, discussed and examined by Dr. Olam Idler who agrees with the assessment and medical plan. Please see her dictated note as well.    Paulita Fujita, PA-C, 08/29/2019

## 2019-10-06 ENCOUNTER — Ambulatory Visit: Payer: Worker's Comp, Other unspecified

## 2019-11-14 IMAGING — MR RM JOELHO DIREITO
4 of 7 series · 17 of 40 positions shown · non-contrast
Comparison: none

------------- REPORT [id number] -------------
I. CLINICAL:
Meniscal/chondral injury.
TECHNIQUE: Examination performed with multiplanar sequences weighted in T1, T2, and PD, with and without fat suppression.
RESULT:
MRI OF THE RIGHT KNEE
Incipient marginal osteophytes in the femorotibial compartments.
Osteophytes in the trochlea, associated with thinning and irregularity of the trochlear cartilage, possibly related to initial osteoarthritis.
Moderate joint effusion.
Small loose body posterior to the posterior cruciate ligament, measuring approximately 0.6 cm.
Other cartilaginous surfaces with usual shape and signal.
Hoffa fat pad without alterations.
Preserved popliteal fossa.
Menisci with usual shape and signal, no evidence of tears.
Intact cruciate and collateral ligaments.
Enthesophytes at the quadriceps insertion and patellar ligament origin on the patella.
Other tendon structures intact.
Preserved muscle groups.
Adipose planes without alterations.
TECHNIQUE: Examination performed with multiplanar sequences weighted in T1, T2, and PD, with and without fat tissue suppression.
MRI OF THE LEFT KNEE
Incipient marginal femorotibial and patellotrochlear osteophytes.
Articular relationships maintained.
Patellar cartilage presenting areas of fissures and erosions at the apex and medial facet of the patella, with foci of subchondral bone edema, indicative of grade IV chondropathy.
Erosion in the cartilage lining the trochlear groove, indicative of grade III.
Moderate joint effusion, with areas of synovial thickening, especially in the suprapatellar recess, and may require follow-up monitoring.
Hoffa's fat pad without alterations.
Menisci with usual shape and signal, without evidence of tears.
Quadriceps tendon and patellar ligament without alterations.
Fluid distension of the popliteal synovial sheath, possibly representing tenosynovitis.

[Series 101: survey_fullfov_transversal · axial · 10.0mm · 1.84mm/px · z∈[-51,+51]mm · 2 of 7 slices shown]
[im 1/7]
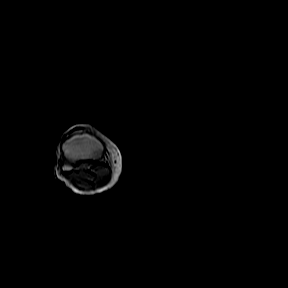
[im 7/7]
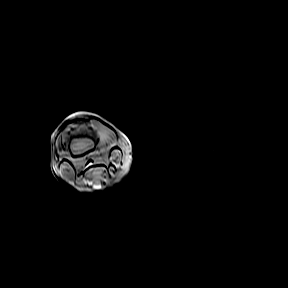

[Series 201: survey · axial · 10.0mm · 1.11mm/px · z∈[-40,+106]mm · 5 of 15 slices shown]
[im 1/15]
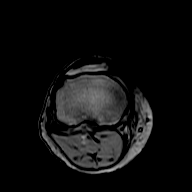
[im 4/15]
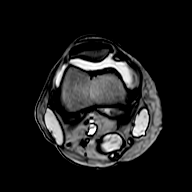
[im 8/15]
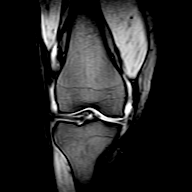
[im 11/15]
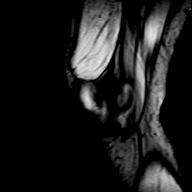
[im 15/15]
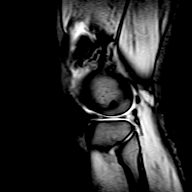

[Series 301: sag dp spair · sagittal · 4.0mm · 0.16mm/px · 3 of 22 slices shown]
[im 4/22]
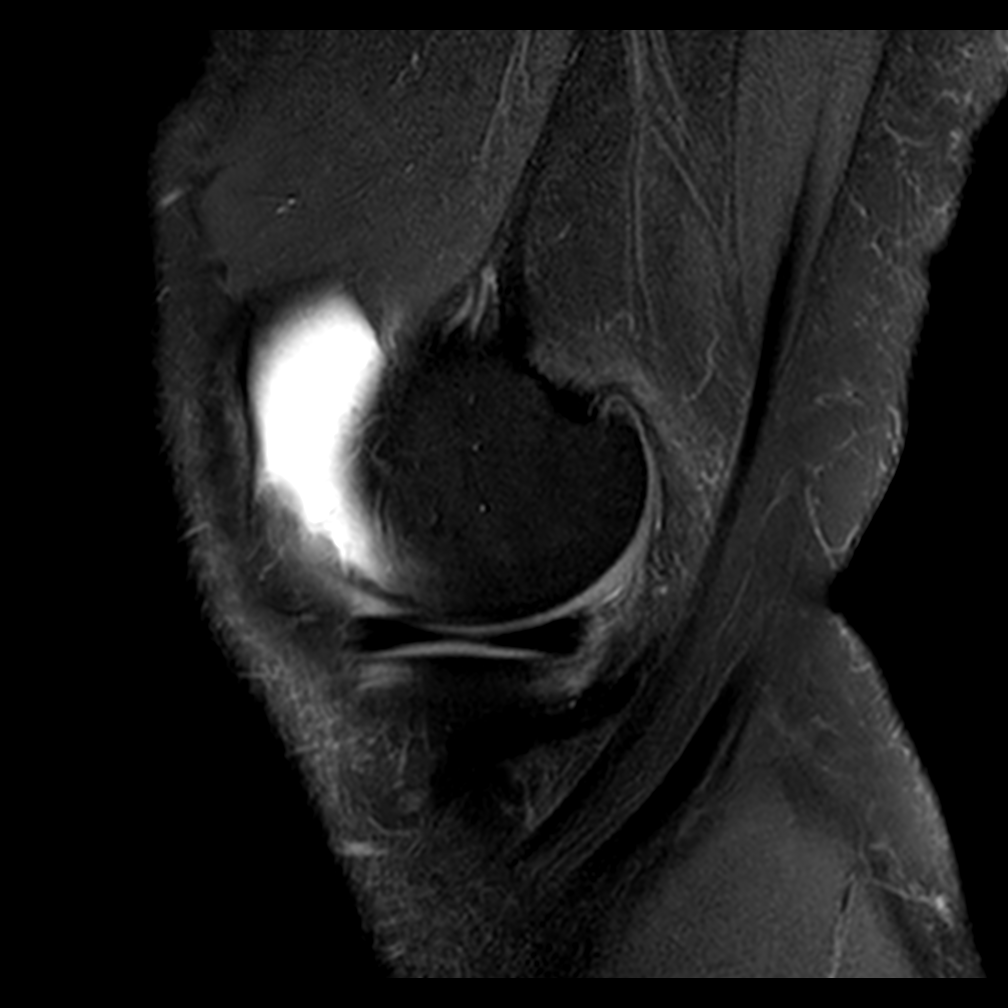
[im 11/22]
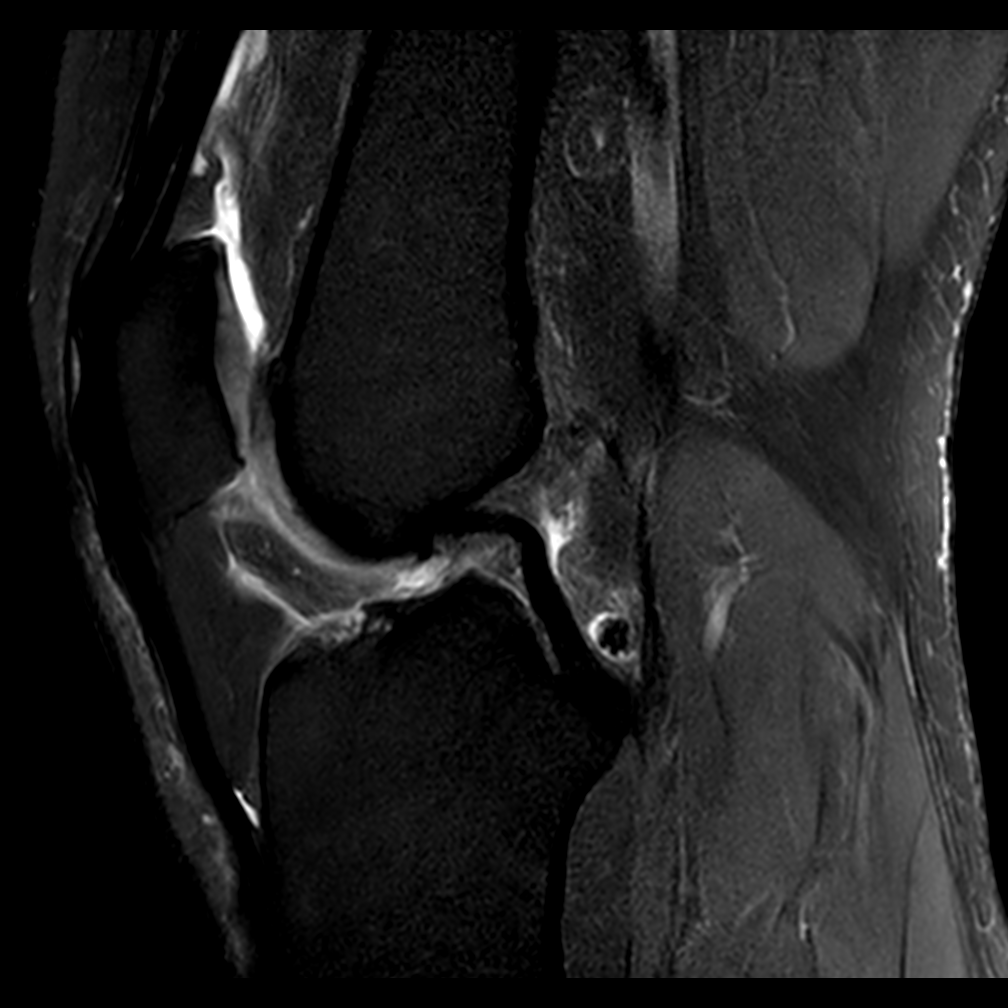
[im 18/22]
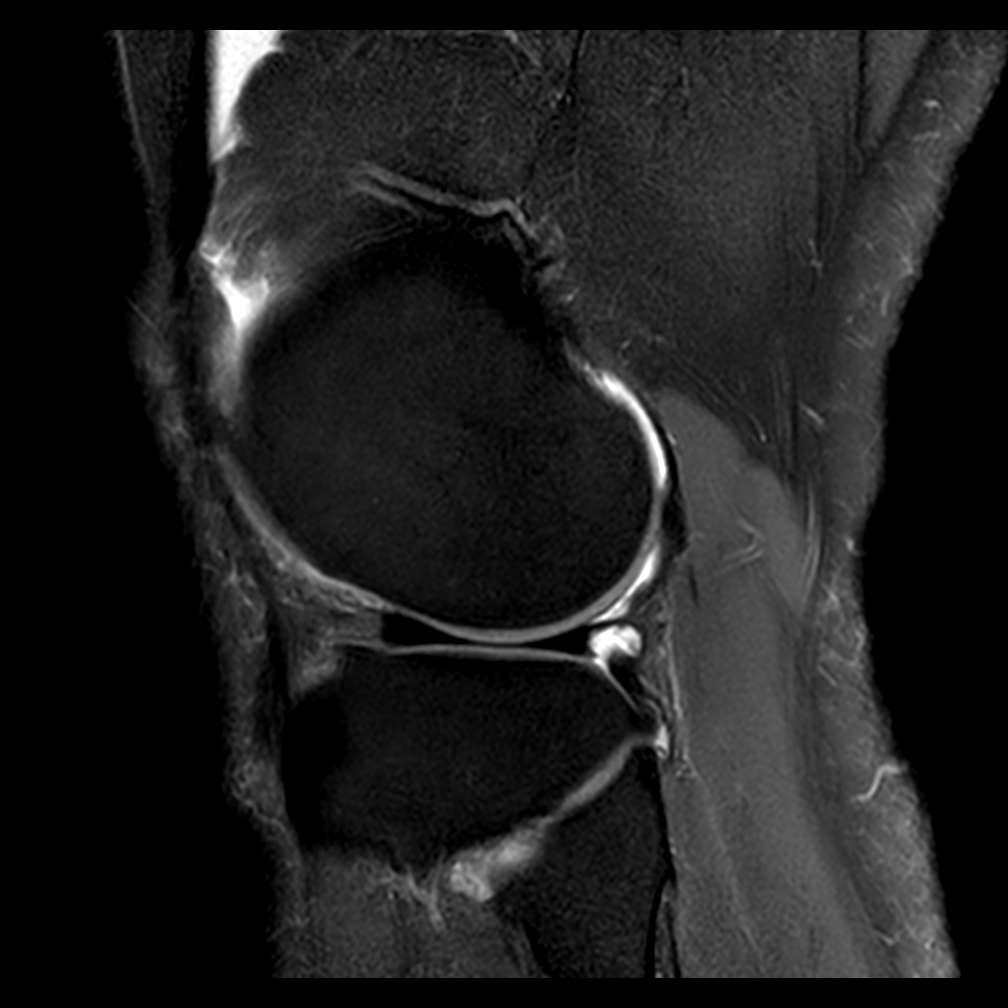

[Series 401: T1 · coronal · 4.0mm · 0.35mm/px · 7 of 22 slices shown]
[im 1/22]
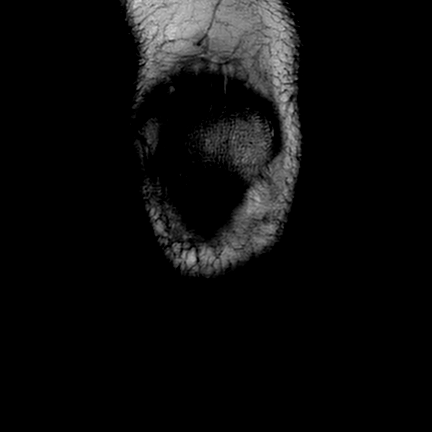
[im 4/22]
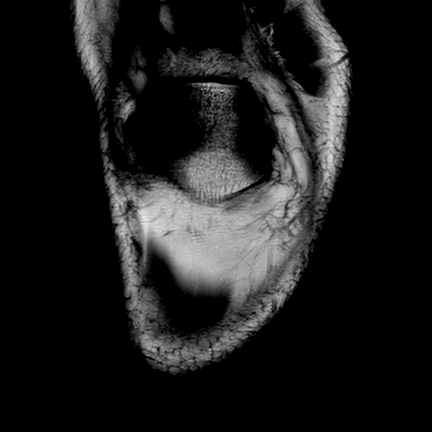
[im 8/22]
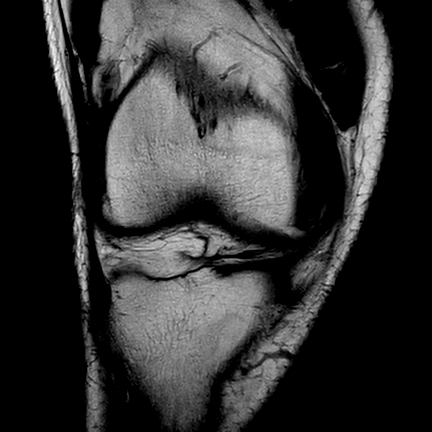
[im 11/22]
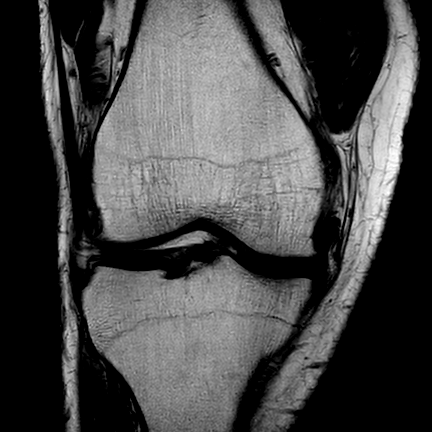
[im 15/22]
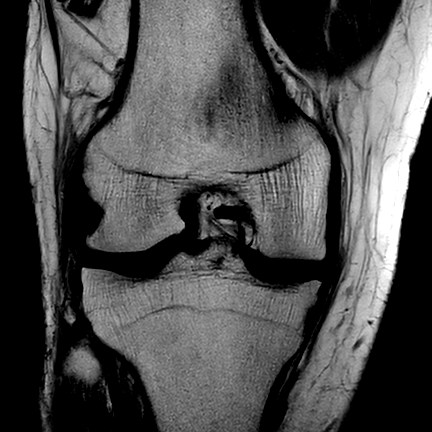
[im 18/22]
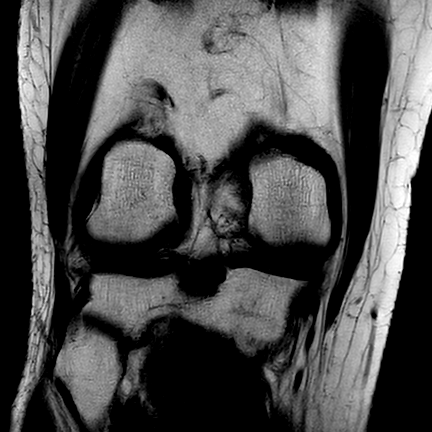
[im 22/22]
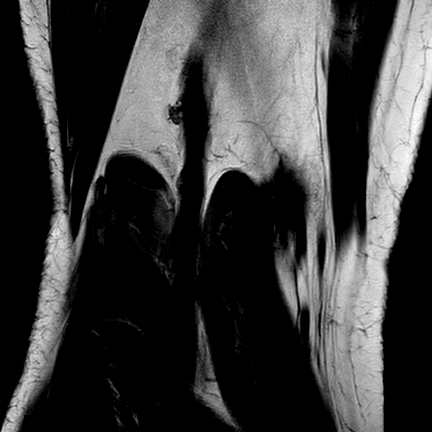

[17 of 40 positions shown; findings below may reference images not displayed]

IMPRESSION: -Incipient marginal osteophytes in the femorotibial compartments.
-Osteophytes in the trochlea, associated with thinning and irregularity of the trochlear cartilage, possibly related to initial osteoarthritis.
-Moderate joint effusion.
-Small loose body posterior to the posterior cruciate ligament, measuring approximately 0.6 cm.
-Enthesophytes at the quadriceps insertion and patellar ligament origin on the patella.
04 films.

------------- REPORT [id number] -------------
I. CLINICAL:
Chondral lesion.
IMPRESSION: Incipient marginal femorotibial and patellotrochlear osteophytes.
-Patellar cartilage presenting areas of fissures and erosions at the apex and medial facet of the patella, with foci of subchondral bone edema, indicative of grade IV chondropathy.
-Erosion in the cartilage lining the trochlear groove, indicative of grade III.
-Moderate joint effusion, with areas of synovial thickening, especially in the suprapatellar recess, and may require follow-up monitoring.

## 2019-11-14 IMAGING — MR RM JOELHO ESQUERDO
4 of 7 series · 17 of 40 positions shown · non-contrast
Comparison: none

------------- REPORT [id number] -------------
I. CLINICAL:
Meniscal/chondral injury.
TECHNIQUE: Examination performed with multiplanar sequences weighted in T1, T2, and PD, with and without fat suppression.
RESULT:
MRI OF THE RIGHT KNEE
Incipient marginal osteophytes in the femorotibial compartments.
Osteophytes in the trochlea, associated with thinning and irregularity of the trochlear cartilage, possibly related to initial osteoarthritis.
Moderate joint effusion.
Small loose body posterior to the posterior cruciate ligament, measuring approximately 0.6 cm.
Other cartilaginous surfaces with usual shape and signal.
Hoffa fat pad without alterations.
Preserved popliteal fossa.
Menisci with usual shape and signal, no evidence of tears.
Intact cruciate and collateral ligaments.
Enthesophytes at the quadriceps insertion and patellar ligament origin on the patella.
Other tendon structures intact.
Preserved muscle groups.
Adipose planes without alterations.
TECHNIQUE: Examination performed with multiplanar sequences weighted in T1, T2, and PD, with and without fat tissue suppression.
MRI OF THE LEFT KNEE
Incipient marginal femorotibial and patellotrochlear osteophytes.
Articular relationships maintained.
Patellar cartilage presenting areas of fissures and erosions at the apex and medial facet of the patella, with foci of subchondral bone edema, indicative of grade IV chondropathy.
Erosion in the cartilage lining the trochlear groove, indicative of grade III.
Moderate joint effusion, with areas of synovial thickening, especially in the suprapatellar recess, and may require follow-up monitoring.
Hoffa's fat pad without alterations.
Menisci with usual shape and signal, without evidence of tears.
Quadriceps tendon and patellar ligament without alterations.
Fluid distension of the popliteal synovial sheath, possibly representing tenosynovitis.

[Series 101: survey_fullfov_transversal · axial · 10.0mm · 1.84mm/px · z∈[-51,+51]mm · 2 of 7 slices shown]
[im 1/7]
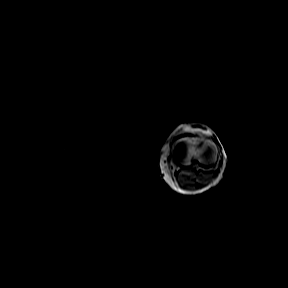
[im 7/7]
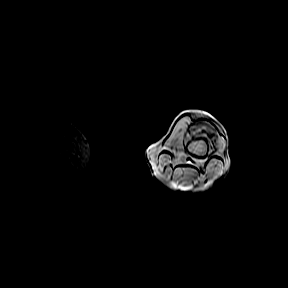

[Series 201: survey · axial · 10.0mm · 1.11mm/px · z∈[-40,+106]mm · 5 of 15 slices shown]
[im 1/15]
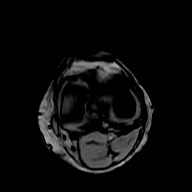
[im 4/15]
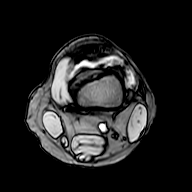
[im 8/15]
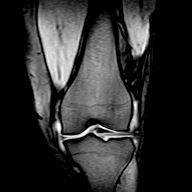
[im 11/15]
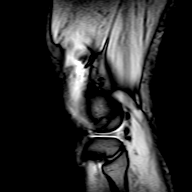
[im 15/15]
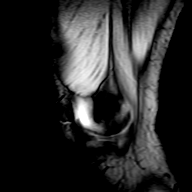

[Series 301: sag dp spair · sagittal · 4.0mm · 0.16mm/px · 3 of 22 slices shown]
[im 4/22]
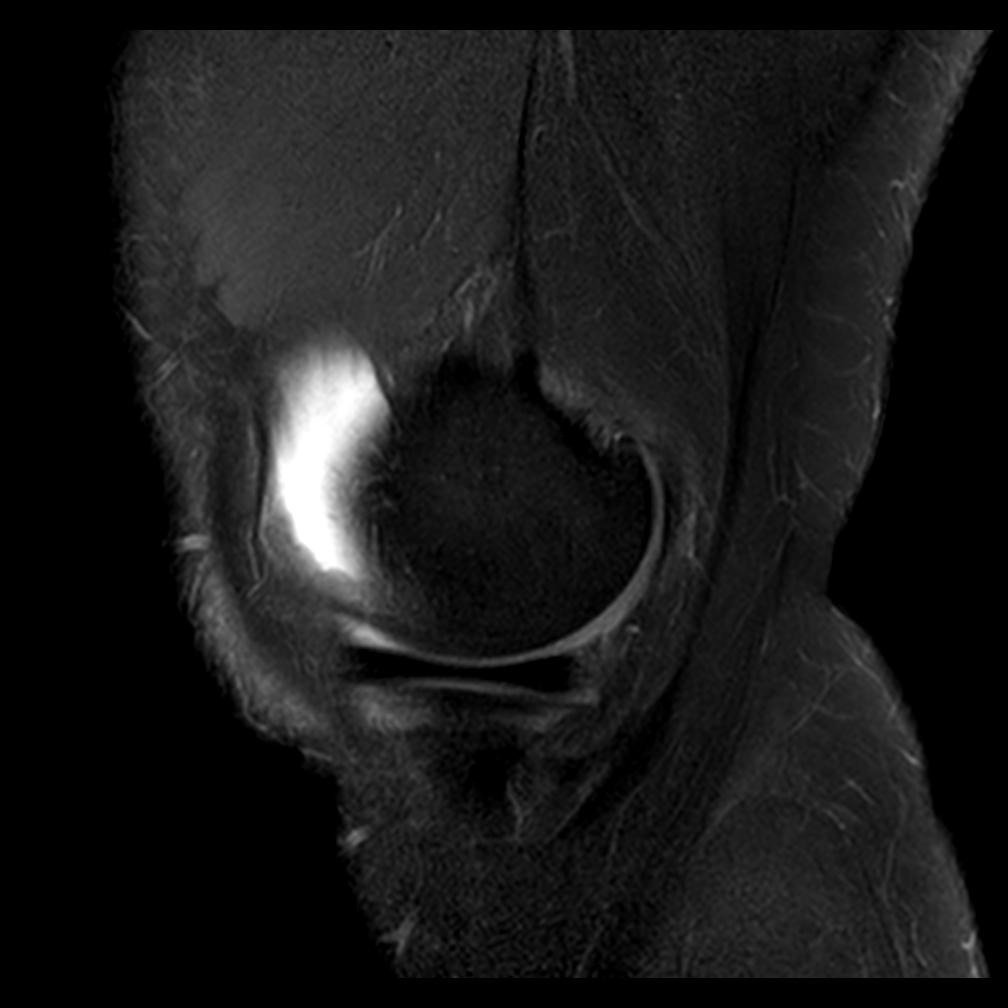
[im 11/22]
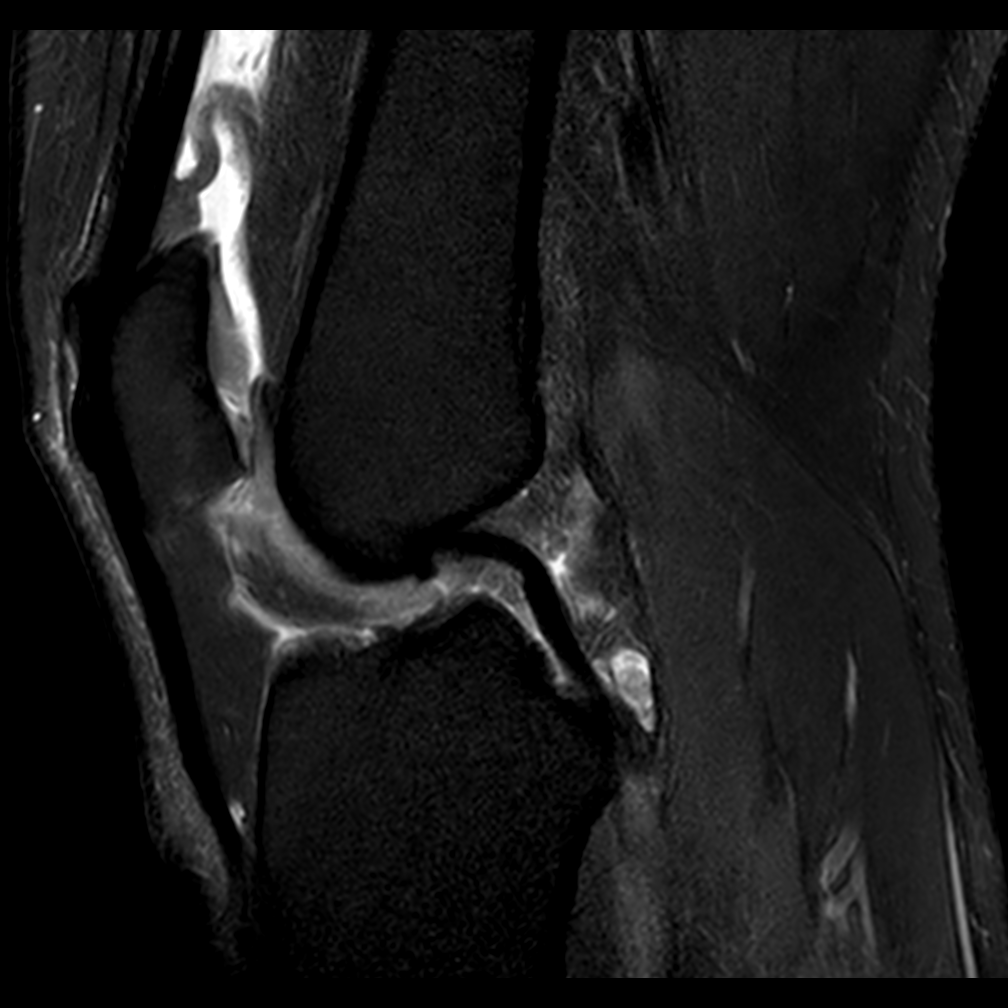
[im 18/22]
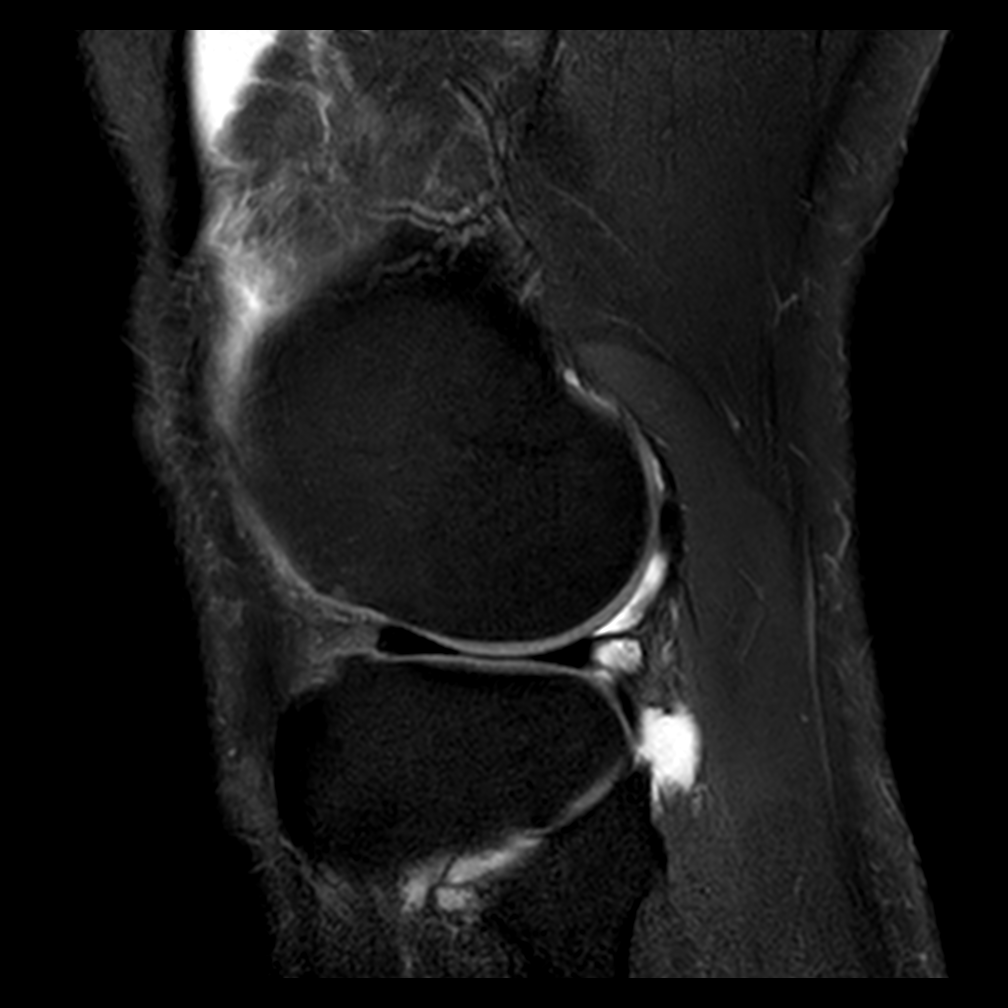

[Series 401: T1 · coronal · 4.0mm · 0.35mm/px · 7 of 22 slices shown]
[im 1/22]
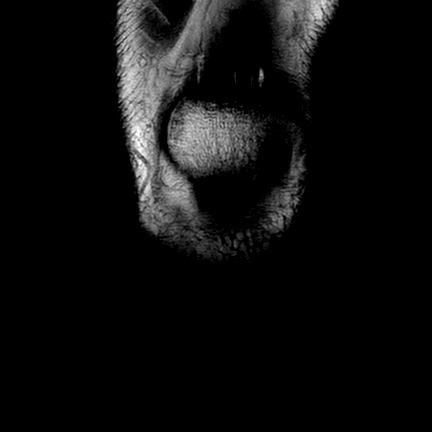
[im 4/22]
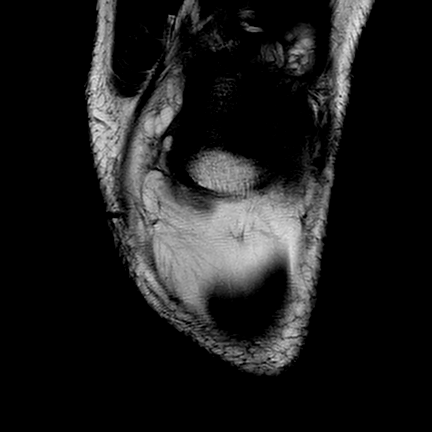
[im 8/22]
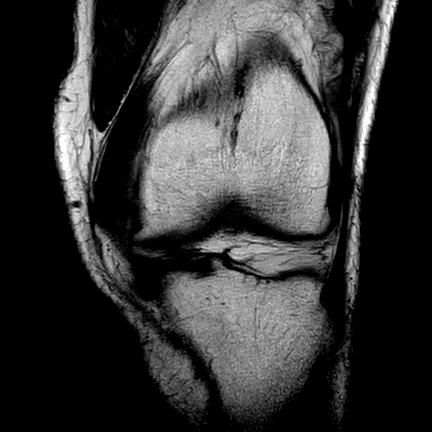
[im 11/22]
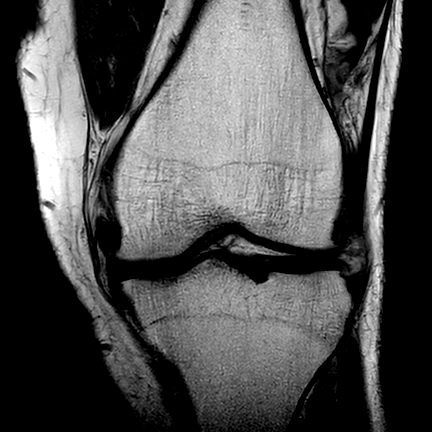
[im 15/22]
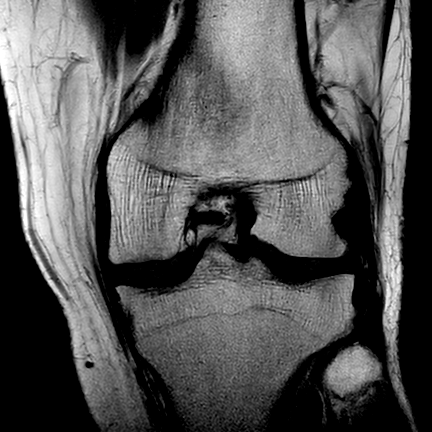
[im 18/22]
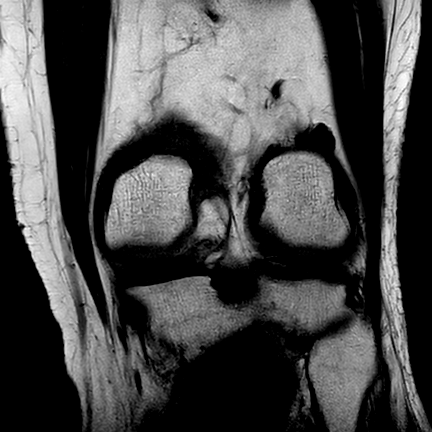
[im 22/22]
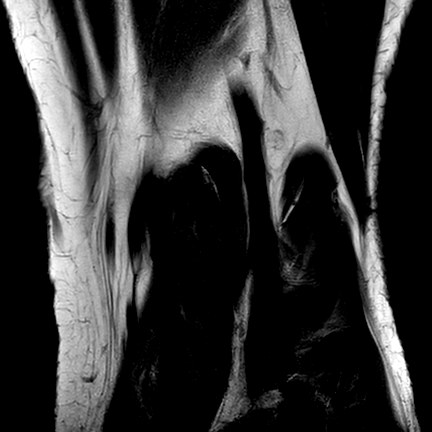

[17 of 40 positions shown; findings below may reference images not displayed]

IMPRESSION: -Incipient marginal osteophytes in the femorotibial compartments.
-Osteophytes in the trochlea, associated with thinning and irregularity of the trochlear cartilage, possibly related to initial osteoarthritis.
-Moderate joint effusion.
-Small loose body posterior to the posterior cruciate ligament, measuring approximately 0.6 cm.
-Enthesophytes at the quadriceps insertion and patellar ligament origin on the patella.
04 films.

------------- REPORT [id number] -------------
I. CLINICAL:
Chondral lesion.
IMPRESSION: Incipient marginal femorotibial and patellotrochlear osteophytes.
-Patellar cartilage presenting areas of fissures and erosions at the apex and medial facet of the patella, with foci of subchondral bone edema, indicative of grade IV chondropathy.
-Erosion in the cartilage lining the trochlear groove, indicative of grade III.
-Moderate joint effusion, with areas of synovial thickening, especially in the suprapatellar recess, and may require follow-up monitoring.

## 2019-12-11 ENCOUNTER — Ambulatory Visit (HOSPITAL_BASED_OUTPATIENT_CLINIC_OR_DEPARTMENT_OTHER): Payer: No Typology Code available for payment source

## 2020-09-27 IMAGING — MR RM JOELHO ESQUERDO
5 of 6 series · 29 of 40 positions shown · non-contrast
Comparison: none

------------- REPORT [id number] -------------
I. CLINICAL:
Meniscal/chondral injury.
TECHNIQUE: Examination performed with multiplanar sequences weighted in T1, T2, and PD, with and without fat suppression.
RESULT:
MRI OF THE RIGHT KNEE
Incipient marginal osteophytes in the femorotibial compartments.
Osteophytes in the trochlea, associated with thinning and irregularity of the trochlear cartilage, possibly related to initial osteoarthritis.
Moderate joint effusion.
Small loose body posterior to the posterior cruciate ligament, measuring approximately 0.6 cm.
Other cartilaginous surfaces with usual shape and signal.
Hoffa's fat pad without alterations.
Preserved popliteal fossa.
Menisci with usual shape and signal, no evidence of tears.
Intact cruciate and collateral ligaments.
Enthesophytes at the quadriceps insertion and patellar ligament origin on the patella.
Other tendon structures intact.
Preserved muscle groups.
Adipose planes without alterations.
TECHNIQUE: Examination performed with multiplanar sequences weighted in T1, T2, and PD, with and without fat tissue suppression.
MRI OF THE LEFT KNEE
Incipient marginal femorotibial and patellotrochlear osteophytes.
Articular relationships maintained.
Patellar cartilage presenting areas of fissures and erosions at the apex and medial facet of the patella, with foci of subchondral bone edema, indicative of grade IV chondropathy.
Erosion in the cartilage lining the trochlear groove, indicative of grade III.
Hoffa's fat pad unremarkable.
Quadriceps tendon and patellar ligament unremarkable.
Fluid distension of the popliteal synovial sheath, possibly representing tenosynovitis.
Adipose planes unremarkable.

[Series 201: survey · axial · 10.0mm · 1.11mm/px · z∈[-40,+106]mm · 6 of 15 slices shown]
[im 1/15]
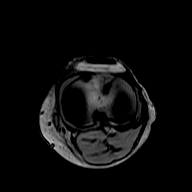
[im 3/15]
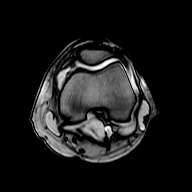
[im 6/15]
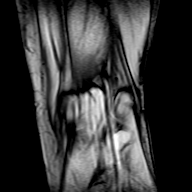
[im 9/15]
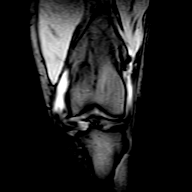
[im 12/15]
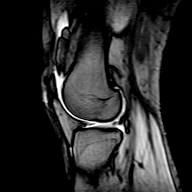
[im 15/15]
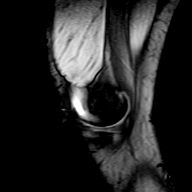

[Series 301: sag dp spair · sagittal · 4.0mm · 0.33mm/px · 7 of 22 slices shown]
[im 1/22]
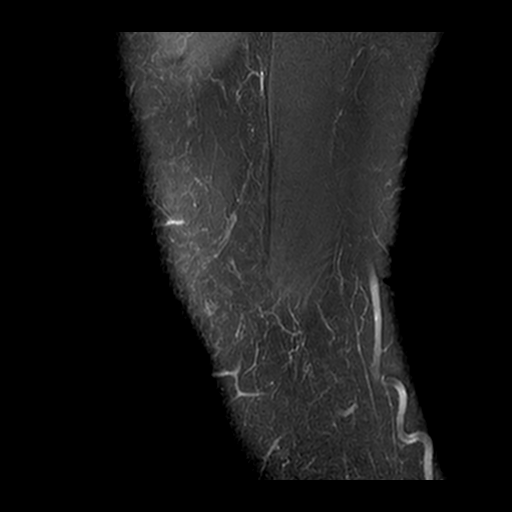
[im 4/22]
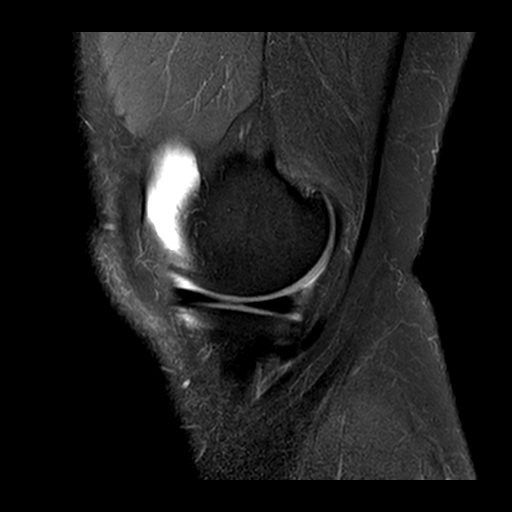
[im 8/22]
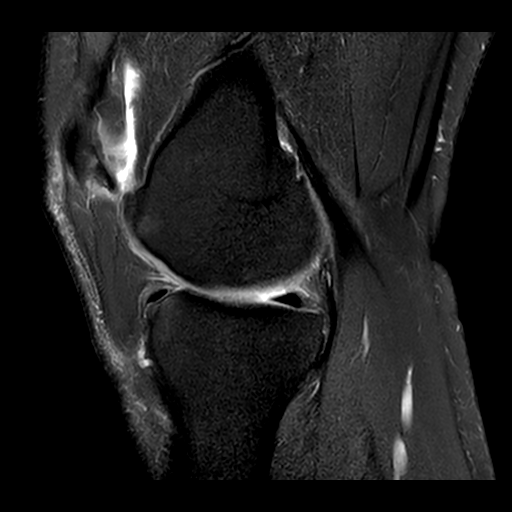
[im 11/22]
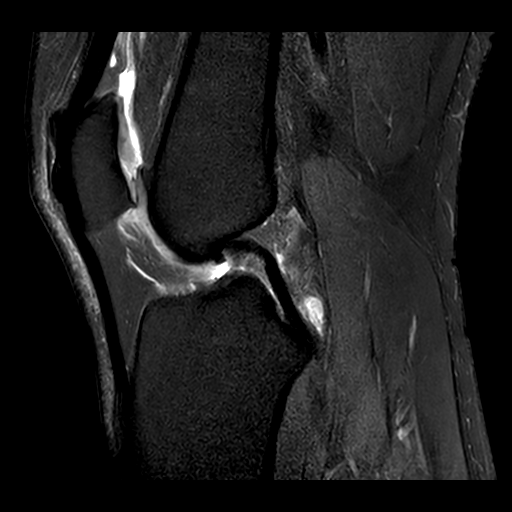
[im 15/22]
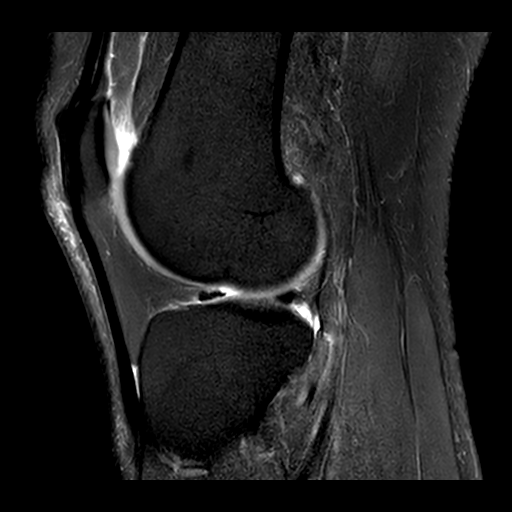
[im 18/22]
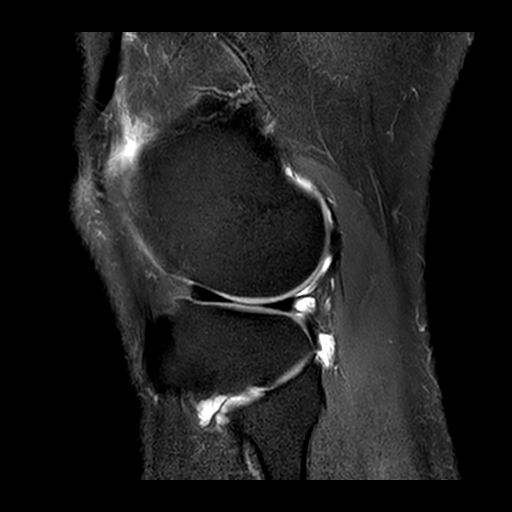
[im 22/22]
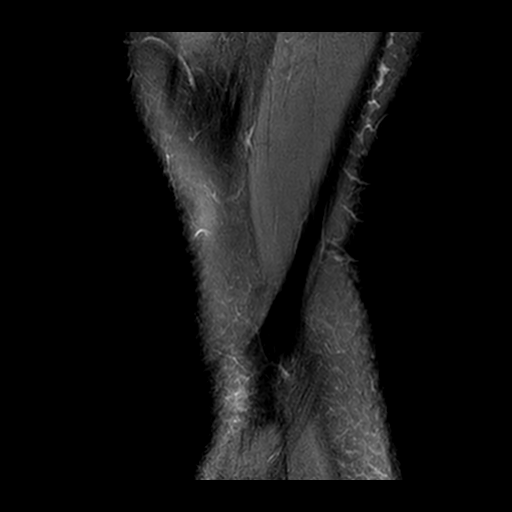

[Series 401: T1 · coronal · 4.0mm · 0.35mm/px · 7 of 22 slices shown]
[im 1/22]
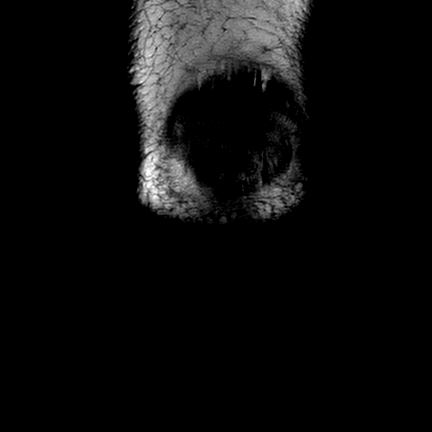
[im 4/22]
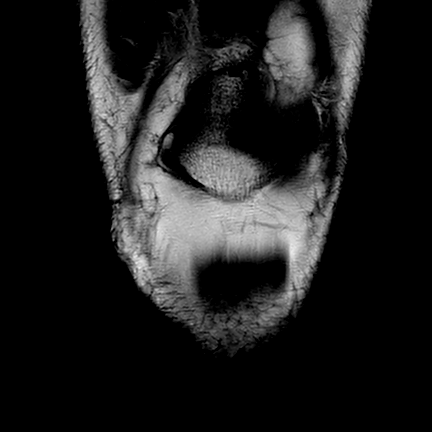
[im 8/22]
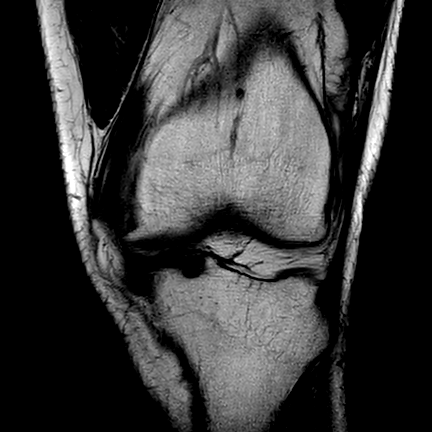
[im 11/22]
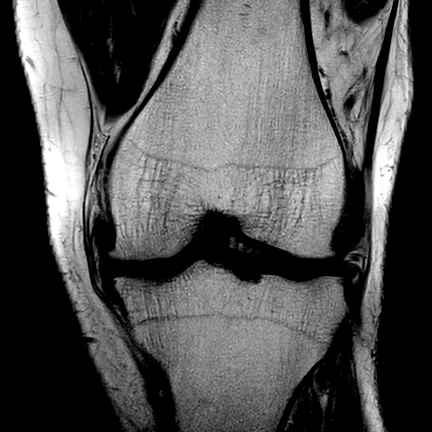
[im 15/22]
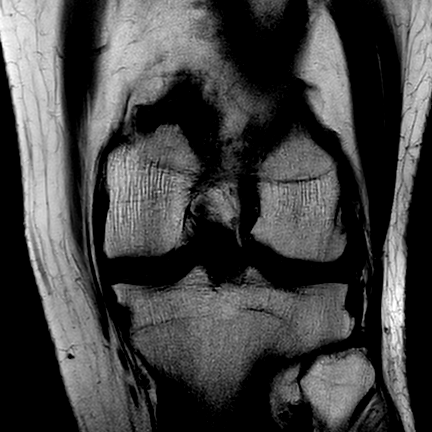
[im 18/22]
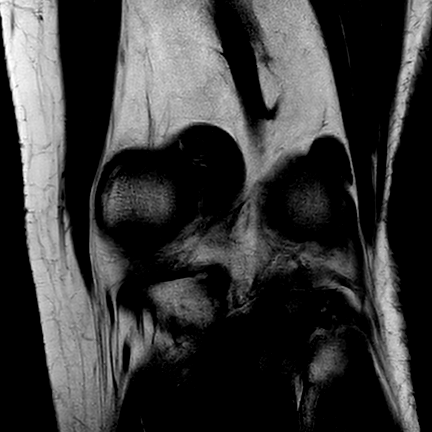
[im 22/22]
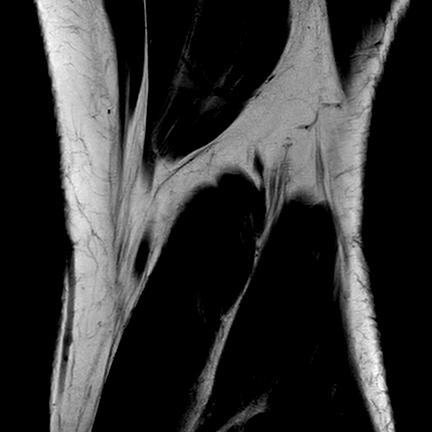

[Series 501: cor dp spair · coronal · 4.0mm · 0.47mm/px · 7 of 22 slices shown]
[im 1/22]
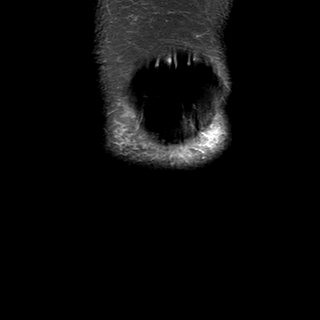
[im 4/22]
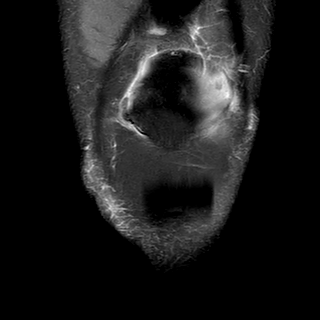
[im 8/22]
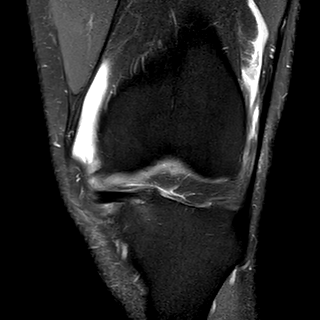
[im 11/22]
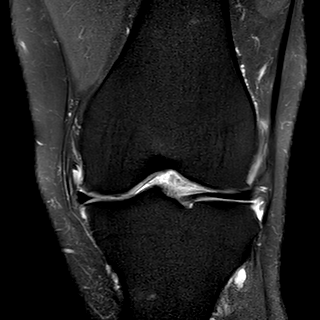
[im 15/22]
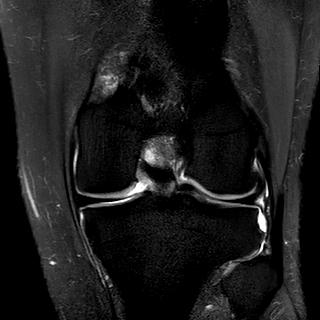
[im 18/22]
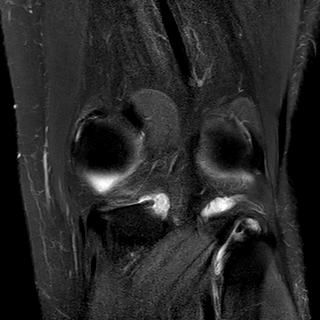
[im 22/22]
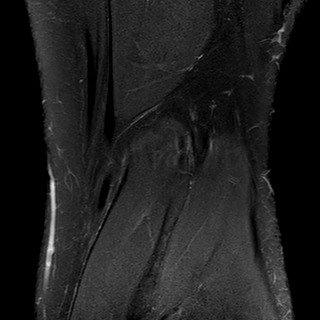

[Series 601: ax dp spair · axial · 4.0mm · 0.33mm/px · z∈[-88,-73]mm · 2 of 26 slices shown]
[im 1/26]
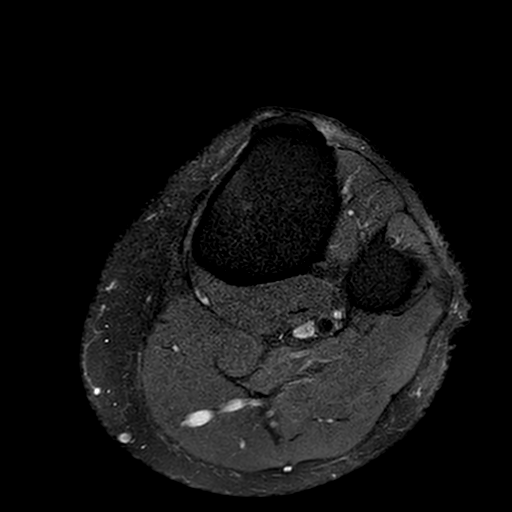
[im 4/26]
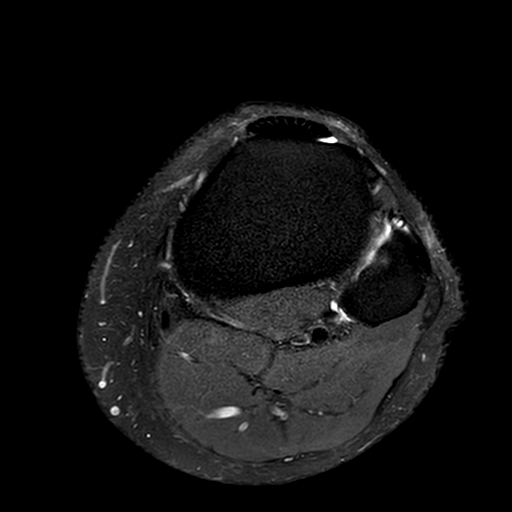

[29 of 40 positions shown; findings below may reference images not displayed]

IMPRESSION: -Incipient marginal osteophytes in the femorotibial compartments.
-Osteophytes in the trochlea, associated with thinning and irregularity of the trochlear cartilage, possibly related to initial osteoarthritis.
-Moderate joint effusion.
-Small loose body posterior to the posterior cruciate ligament, measuring approximately 0.6 cm.
-Enthesophytes at the quadriceps insertion and patellar ligament origin on the patella.
04 films.

------------- REPORT [id number] -------------
I. CLINICAL:
Chondral lesion.
IMPRESSION: Incipient marginal femorotibial and patellotrochlear osteophytes.
-Patellar cartilage presenting areas of fissures and erosions at the apex and medial facet of the patella, with foci of subchondral bone edema, indicative of grade IV chondropathy.
-Erosion in the cartilage lining the trochlear groove, indicative of grade III.
-Moderate joint effusion.

## 2020-09-27 IMAGING — MR RM JOELHO DIREITO
4 of 6 series · 28 of 40 positions shown · non-contrast
Comparison: none

------------- REPORT [id number] -------------
I. CLINICAL:
Meniscal/chondral injury.
TECHNIQUE: Examination performed with multiplanar sequences weighted in T1, T2, and PD, with and without fat suppression.
RESULT:
MRI OF THE RIGHT KNEE
Incipient marginal osteophytes in the femorotibial compartments.
Osteophytes in the trochlea, associated with thinning and irregularity of the trochlear cartilage, possibly related to initial osteoarthritis.
Moderate joint effusion.
Small loose body posterior to the posterior cruciate ligament, measuring approximately 0.6 cm.
Other cartilaginous surfaces with usual shape and signal.
Hoffa's fat pad without alterations.
Preserved popliteal fossa.
Menisci with usual shape and signal, no evidence of tears.
Intact cruciate and collateral ligaments.
Enthesophytes at the quadriceps insertion and patellar ligament origin on the patella.
Other tendon structures intact.
Preserved muscle groups.
Adipose planes without alterations.
TECHNIQUE: Examination performed with multiplanar sequences weighted in T1, T2, and PD, with and without fat tissue suppression.
MRI OF THE LEFT KNEE
Incipient marginal femorotibial and patellotrochlear osteophytes.
Articular relationships maintained.
Patellar cartilage presenting areas of fissures and erosions at the apex and medial facet of the patella, with foci of subchondral bone edema, indicative of grade IV chondropathy.
Erosion in the cartilage lining the trochlear groove, indicative of grade III.
Hoffa's fat pad unremarkable.
Quadriceps tendon and patellar ligament unremarkable.
Fluid distension of the popliteal synovial sheath, possibly representing tenosynovitis.
Adipose planes unremarkable.

[Series 201: survey · axial · 10.0mm · 1.11mm/px · z∈[-40,+106]mm · 4 of 15 slices shown]
[im 1/15]
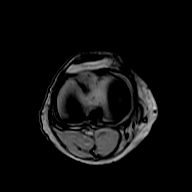
[im 5/15]
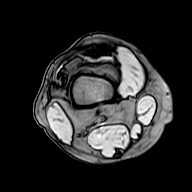
[im 10/15]
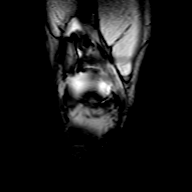
[im 15/15]
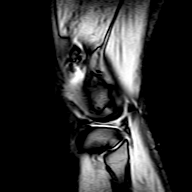

[Series 301: sag dp spair · sagittal · 4.0mm · 0.33mm/px · 8 of 22 slices shown]
[im 1/22]
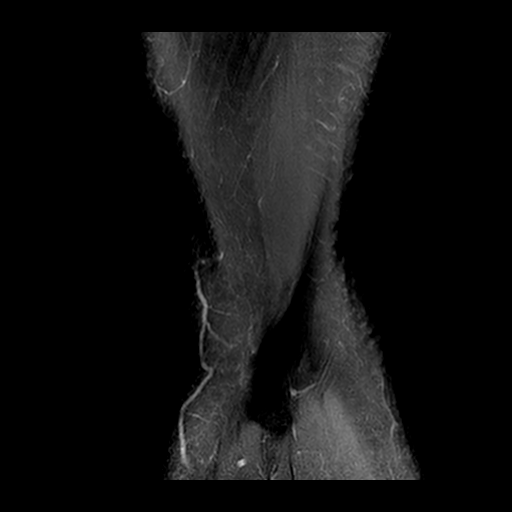
[im 4/22]
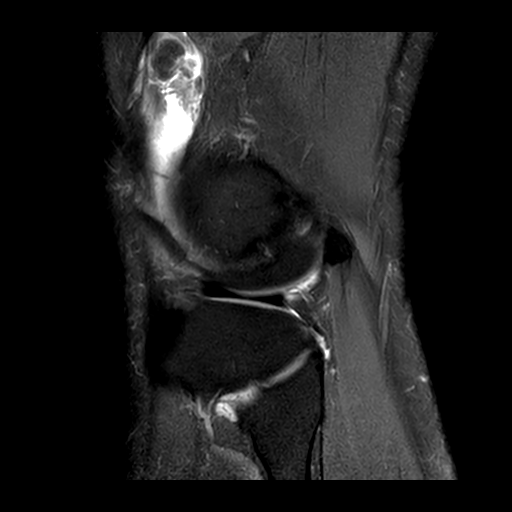
[im 7/22]
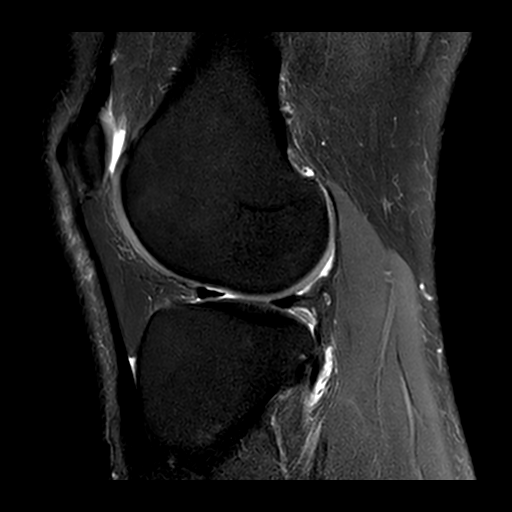
[im 10/22]
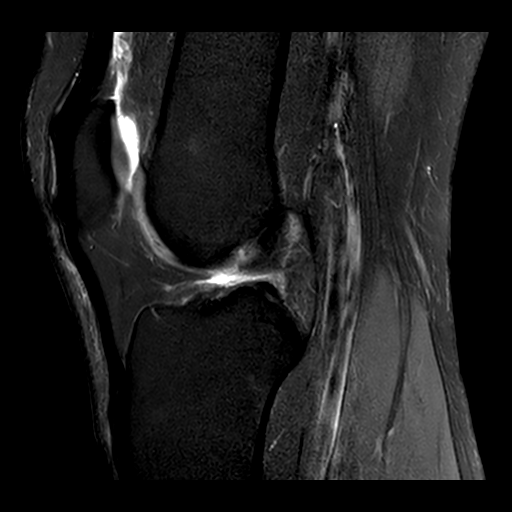
[im 13/22]
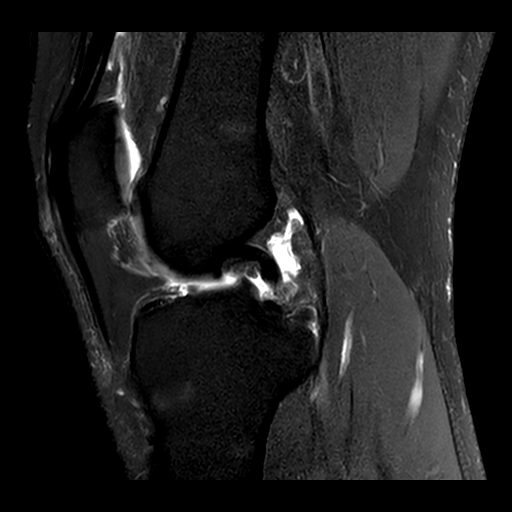
[im 16/22]
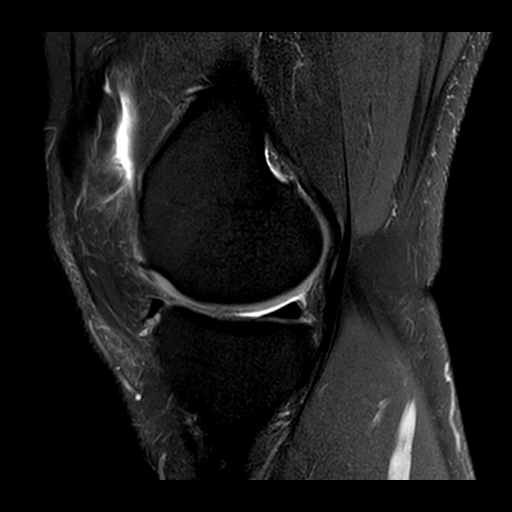
[im 19/22]
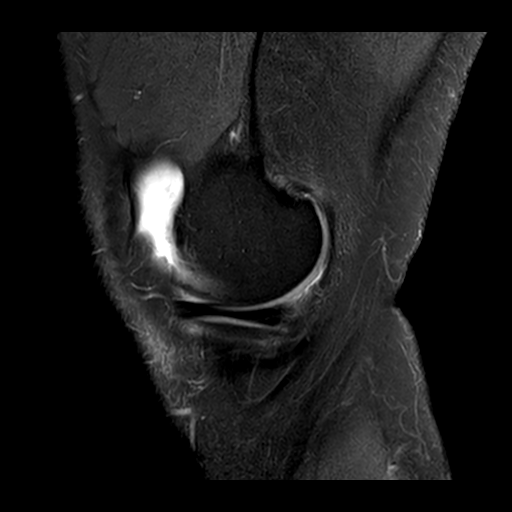
[im 22/22]
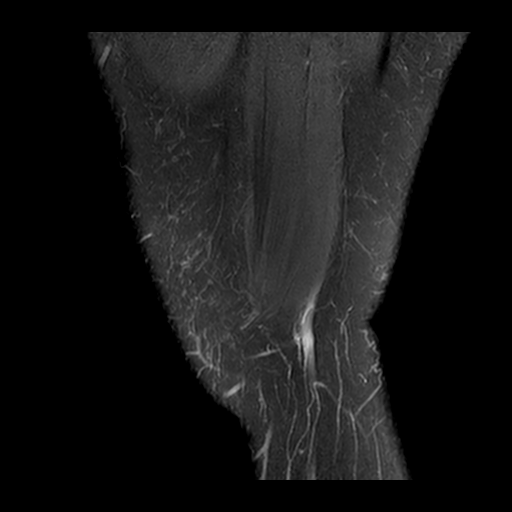

[Series 401: T1 · coronal · 4.0mm · 0.35mm/px · 8 of 22 slices shown]
[im 1/22]
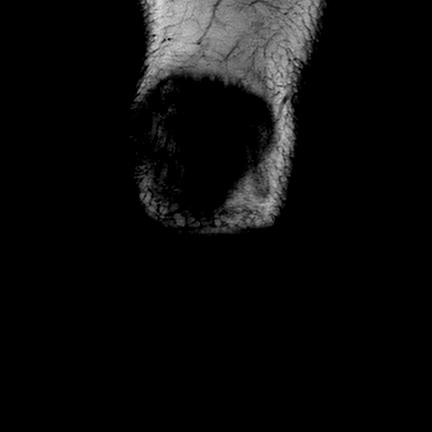
[im 4/22]
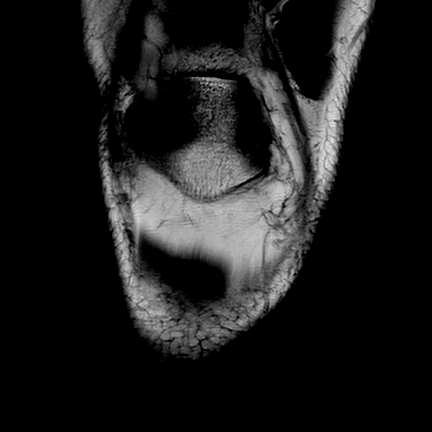
[im 7/22]
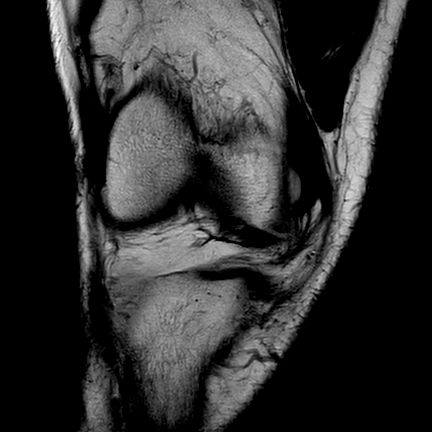
[im 10/22]
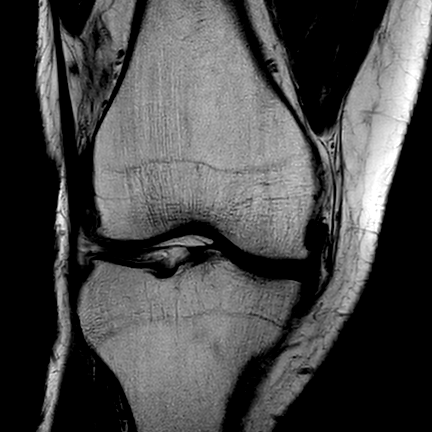
[im 13/22]
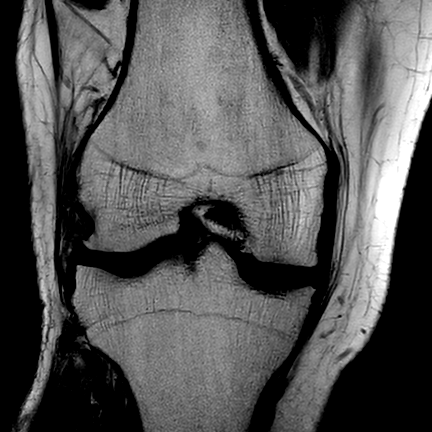
[im 16/22]
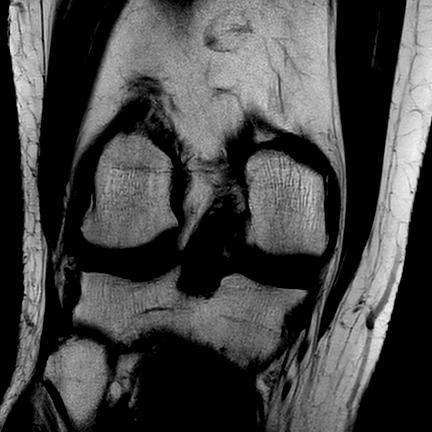
[im 19/22]
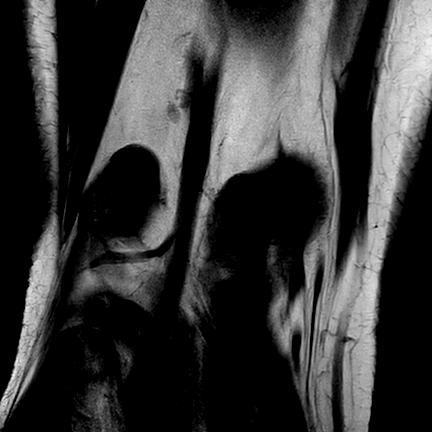
[im 22/22]
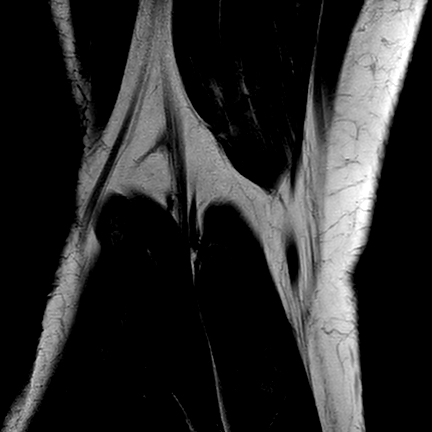

[Series 501: cor dp spair · coronal · 4.0mm · 0.47mm/px · 8 of 22 slices shown]
[im 1/22]
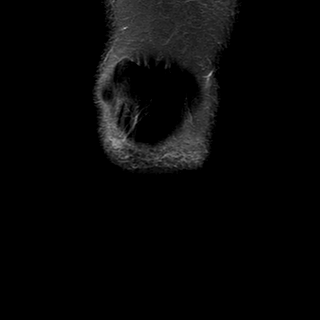
[im 4/22]
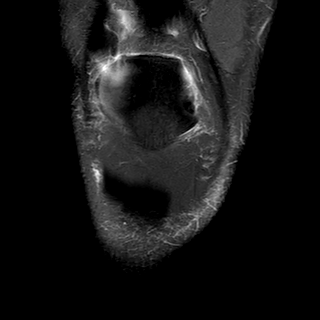
[im 7/22]
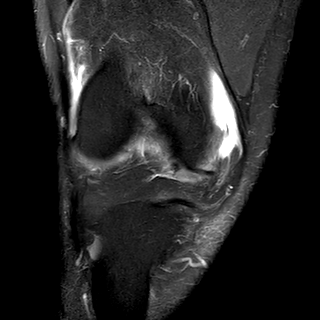
[im 10/22]
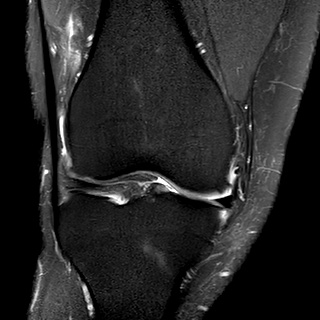
[im 13/22]
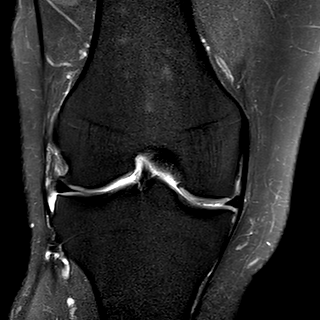
[im 16/22]
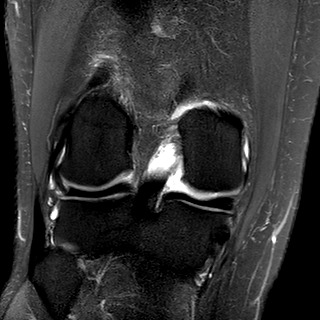
[im 19/22]
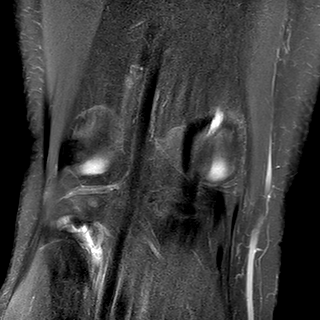
[im 22/22]
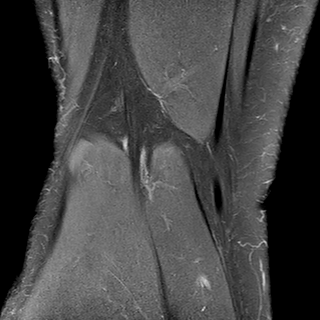

[28 of 40 positions shown; findings below may reference images not displayed]

IMPRESSION: -Incipient marginal osteophytes in the femorotibial compartments.
-Osteophytes in the trochlea, associated with thinning and irregularity of the trochlear cartilage, possibly related to initial osteoarthritis.
-Moderate joint effusion.
-Small loose body posterior to the posterior cruciate ligament, measuring approximately 0.6 cm.
-Enthesophytes at the quadriceps insertion and patellar ligament origin on the patella.
04 films.

------------- REPORT [id number] -------------
I. CLINICAL:
Chondral lesion.
IMPRESSION: Incipient marginal femorotibial and patellotrochlear osteophytes.
-Patellar cartilage presenting areas of fissures and erosions at the apex and medial facet of the patella, with foci of subchondral bone edema, indicative of grade IV chondropathy.
-Erosion in the cartilage lining the trochlear groove, indicative of grade III.
-Moderate joint effusion.

## 2022-02-02 ENCOUNTER — Encounter (HOSPITAL_BASED_OUTPATIENT_CLINIC_OR_DEPARTMENT_OTHER): Payer: Self-pay | Admitting: Internal Medicine

## 2022-02-02 ENCOUNTER — Other Ambulatory Visit: Payer: Self-pay

## 2022-02-02 ENCOUNTER — Ambulatory Visit: Payer: No Typology Code available for payment source | Attending: Internal Medicine | Admitting: Internal Medicine

## 2022-02-02 VITALS — BP 103/62 | HR 70 | Temp 97.7°F | Wt 141.0 lb

## 2022-02-02 DIAGNOSIS — Z3169 Encounter for other general counseling and advice on procreation: Secondary | ICD-10-CM | POA: Diagnosis present

## 2022-02-02 DIAGNOSIS — Z1212 Encounter for screening for malignant neoplasm of rectum: Secondary | ICD-10-CM | POA: Insufficient documentation

## 2022-02-02 DIAGNOSIS — N200 Calculus of kidney: Secondary | ICD-10-CM | POA: Diagnosis present

## 2022-02-02 DIAGNOSIS — Z1211 Encounter for screening for malignant neoplasm of colon: Secondary | ICD-10-CM | POA: Diagnosis present

## 2022-02-02 DIAGNOSIS — Z0000 Encounter for general adult medical examination without abnormal findings: Secondary | ICD-10-CM | POA: Insufficient documentation

## 2022-02-02 LAB — HEPATIC FUNCTION PANEL
ALANINE AMINOTRANSFERASE: 42 U/L (ref 12–45)
ALBUMIN: 4.8 g/dL (ref 3.4–5.2)
ALKALINE PHOSPHATASE: 79 U/L (ref 45–117)
ASPARTATE AMINOTRANSFERASE: 28 U/L (ref 8–34)
BILIRUBIN DIRECT: <0.2 mg/dL (ref 0.0–0.2)
BILIRUBIN TOTAL: 0.7 mg/dL (ref 0.2–1.0)
TOTAL PROTEIN: 7.8 g/dL (ref 6.4–8.2)

## 2022-02-02 LAB — LIPID PANEL
Cholesterol: 201 mg/dL (ref 0–239)
HIGH DENSITY LIPOPROTEIN: 42 mg/dL (ref 40–60)
LOW DENSITY LIPOPROTEIN DIRECT: 141 mg/dL (ref 0–189)
TRIGLYCERIDES: 180 mg/dL — ABNORMAL HIGH (ref 0–150)

## 2022-02-02 LAB — HEPATITIS C ANTIBODY: HEPATITIS C ANTIBODY: NONREACTIVE

## 2022-02-02 LAB — HEPATITIS B SURFACE AB QUANT: HEPATITIS B SURFACE AB QUANT: 86.4 m[IU]/mL (ref 0.0–8.4)

## 2022-02-02 LAB — HEMOGLOBIN A1C
ESTIMATED AVERAGE GLUCOSE: 97 mg/dL (ref 74–160)
HEMOGLOBIN A1C: 5 % (ref 4.0–5.6)

## 2022-02-02 LAB — HEPATITIS B SURFACE ANTIGEN: HEPATITIS B SURFACE ANTIGEN: NONREACTIVE

## 2022-02-02 LAB — HEPATITIS B CORE ANTIBODY: HEPATITIS B CORE ANTIBODY: REACTIVE — AB

## 2022-02-02 NOTE — Progress Notes (Signed)
Cc: PE, new pt    He has no concerns other than referral to urology for vasectomy  Has 3 children & not interested in more    Patient Active Problem List    Nephrolithiasis         Priority: Medium [2]         Date Noted: 02/02/2022            H/o nephrolithiasis         Medication List      as of February 02, 2022  4:16 PM     You have not been prescribed any medications.       Review of Patient's Allergies indicates:   Rise Patience verify*      Past Medical History:  No date: NO KNOWN PROBLEMS  Past Surgical History:  07/15/2005: CATARACT REMOVAL INSERTION OF LENS      Comment:  RIGHT EYE - [id number] Jari Favre. Nathaneil Canary, MD  Social History     Socioeconomic History   . Marital status: Married     Spouse name: Not on file   . Number of children: Not on file   . Years of education: Not on file   . Highest education level: Not on file   Occupational History   . Not on file   Tobacco Use   . Smoking status: Never   . Smokeless tobacco: Never   Vaping Use   . Vaping status: Not on file   Substance and Sexual Activity   . Alcohol use: No   . Drug use: No   . Sexual activity: Not on file   Other Topics Concern   . Not on file   Social History Narrative    he is originally from [place].  He came to the [place] in 2001.     In [place], he did graduated from college    In [place], he worked Education officer, community.     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    He lives with wife & 3 children     he has 3 children : 66 y/o, 62 y/o & 3y/o   Social Determinants of Catering manager Strain: Not on file  Food Insecurity: Not on file  Transportation Needs: Not on file  Physical Activity: Not on file  Stress: Not on file  Social Connections: Not on file  Intimate Partner Violence: Not on file  Housing Stability: Not on file  Review of patient's family history indicates:  Problem: Alcohol/Drug Abuse      Relation: Father          Age of Onset: (Not Specified)  Problem: Heart Disease      Relation: Father          Age of Onset: 52          Comment:  died from complications from a hospitalization; heavy               driniking; not sure if from heart or not       BP 103/62   Pulse 70   Temp 97.7 F (36.5 C) (Temporal)   Wt 64 kg (141 lb)   SpO2 99%    General: seated comfortable in NAD, alert, pleasant and cooperative  The neck is supple and free of adenopathy or masses, the thyroid is normal without enlargement or nodules.  Heart: S1 and S2 normal, no murmurs, clicks, gallops or rubs. Regular rate and rhythm.   Lungs:  clear; no wheezes, rhonchi or rales.  Extremities: extremities, peripheral pulses and reflexes normal, no edema, redness or tenderness in the calves or thighs.   MMSE: well groomed, affect normal, no tangential thoughts, no pressured speech, no agitation or psychomotor slowing, normal response-time to questions  Neuro: alert & oriented x 3, no dysarthria or aphasia, gait wnl     ASSESSMENT & PLAN:  (Z00.00) Routine general medical examination at a health care facility  (primary encounter diagnosis)  Comment: reviewed all routine health maintenance issues   Plan: HEPATIC FUNCTION PANEL, HEPATITIS B CORE         ANTIBODY, HEPATITIS B SURFACE AB QUANT,         HEPATITIS B SURFACE ANTIGEN, HEPATITIS C         ANTIBODY, HIV ANTIGEN ANTIBODY 5TH GEN, LIPID         PANEL, HEMOGLOBIN A1C            (Z12.11,  Z12.12) Encounter for colorectal cancer screening  Comment: will chekc  Plan: STOOL DNA            (N20.0) Nephrolithiasis  Comment: discussed importance of high water intake, avoid excessive salt    (Z31.69) Infertility counseling  Comment: refer for vasectomy   Plan: REFERRAL TO UROLOGY (INT)              The patient was ready to learn and no apparent learning or adherence barriers were identified. I explained the diagnosis and treatment plan, and the patient expressed understanding of the content. I attempted to answer any questions regarding the diagnosis and the proposed treatment.    Possible side effects of the prescribed medication was  explained. We discussed the patient's current medications.  We discussed the importance of medication compliance. The patient expressed understanding and no barriers to adherence were identified.    follow-up will be scheduled for 1 year from now  he has been advised to call or return with any worsening or new problems         Declines covid vaccine

## 2022-02-04 LAB — HIV ANTIGEN ANTIBODY 5TH GEN: HIVAGAB QUALITATIVE: NONREACTIVE

## 2022-03-19 LAB — STOOL DNA: NONINV COLON CA DNA + OCC BLD SCREEN STL -IMP: NEGATIVE

## 2022-03-23 ENCOUNTER — Encounter (HOSPITAL_BASED_OUTPATIENT_CLINIC_OR_DEPARTMENT_OTHER): Payer: Self-pay | Admitting: Internal Medicine

## 2022-03-23 ENCOUNTER — Ambulatory Visit: Payer: No Typology Code available for payment source | Admitting: Internal Medicine

## 2022-03-23 DIAGNOSIS — B1910 Unspecified viral hepatitis B without hepatic coma: Secondary | ICD-10-CM | POA: Insufficient documentation

## 2022-03-23 NOTE — Progress Notes (Signed)
We reviewed recent results:  Cologuard neg - need to repeat in 3 years - he understands    Hep B - h/o infection, recovered  Will need to confirm that he is immune to hep A - has never received vaccine for that  Will check serologies & see if vaccination is necessary   Will touch base w/ ID to see if any other precautions indicated    .20 minutes was spent on the day of the visit on some or all of the following activities:  . Preparing to see the patient (eg, review of tests)  . Obtaining and/or reviewing separately obtained history  . Performing a medically appropriate examination and/or evaluation  . Counseling and educating the patient/family/caregiver  . Ordering medications, tests, or procedures  . Referring and communicating with other health care professionals   . Documenting clinical information in the electronic or other health record  . Independently interpreting results and  communicating results to the patient/family/caregiver  . Care coordination

## 2022-03-24 ENCOUNTER — Encounter (HOSPITAL_BASED_OUTPATIENT_CLINIC_OR_DEPARTMENT_OTHER): Payer: Self-pay | Admitting: Internal Medicine

## 2022-03-27 ENCOUNTER — Other Ambulatory Visit: Payer: Self-pay

## 2022-03-27 ENCOUNTER — Ambulatory Visit: Payer: No Typology Code available for payment source | Attending: Internal Medicine | Admitting: Specialist

## 2022-03-27 ENCOUNTER — Encounter (HOSPITAL_BASED_OUTPATIENT_CLINIC_OR_DEPARTMENT_OTHER): Payer: Self-pay | Admitting: Specialist

## 2022-03-27 DIAGNOSIS — Z3009 Encounter for other general counseling and advice on contraception: Secondary | ICD-10-CM

## 2022-03-27 NOTE — Progress Notes (Unsigned)
CC: male infertility evaluation    This is a 46 year old male patient who has been referred today in consultation  for male infertility evaluation.     To characterize the patient's chief complaint:      The patient is married, male partner, 17-1yrs.    They have unprotected intercourse and sometimes condoms for protection.    Both parties 3 have prior children.    No penile discharge.    No history of STD.    No tobacco.     No EtOH.     Diet: Fruits/vegetables everyday.     No Prior pelvic/scrotal surgery/vasectomy    No Prior trauma.    No steroid usage.    No testicular pain    Voiding complaints:  Associated symptoms:  no dysuria.  no hematuria.  no urinary frequency.  no urinary urgency.  no UTI.  h/o kidney stones, passed on own, seen by Dr.Leung 2014 (prostatic cyst on CT)  no fevers/chills.  no nausea/emesis.      Works as Administrator, sports.      Does  wear tight underwear. Recommend wear briefs.          Past Medical History:  No date: NO KNOWN PROBLEMS    Past Surgical History:  07/15/2005: CATARACT REMOVAL INSERTION OF LENS      Comment:  RIGHT EYE - [id number] Jari Favre. Nathaneil Canary, MD    No current outpatient medications on file prior to visit.  No current facility-administered medications on file prior to visit.      Review of Patient's Allergies indicates:   Nkda [please verify*        Family History:  No history of kidney cancer.  No history of kidney stones.  No history of bladder cancer.  No history of prostate cancer.    Social History     Socioeconomic History    Marital status: Married     Spouse name: Not on file    Number of children: Not on file    Years of education: Not on file    Highest education level: Not on file   Occupational History    Not on file   Tobacco Use    Smoking status: Never    Smokeless tobacco: Never   Substance and Sexual Activity    Alcohol use: No    Drug use: No    Sexual activity: Not on file   Other Topics Concern    Not on file   Social History Narrative    he is originally  from [place].  He came to the [place] in 2001.     In [place], he did graduated from college    In [place], he worked Education officer, community.     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    He lives with wife & 3 children     he has 3 children : 67 y/o, 88 y/o & 3y/o   Social Determinants of Catering manager Strain: Not on file  Food Insecurity: Not on file  Transportation Needs: Not on file  Physical Activity: Not on file  Stress: Not on file  Social Connections: Not on file  Intimate Partner Violence: Not on file  Housing Stability: Not on file  Social history has been reviewed in the chart, and no new changes are noted.      REVIEW OF SYSTEMS:  CONSTITUTIONAL: Negative for any fever, chills, weight loss or weight gain.  EYES: No eye pain,  eye redness  ENT: No difficulty swallowing. No nose bleeds. No sore throat. No ear ache.  CARDIOVASCULAR: No chest pain. No palpitation. No fainting.  RESPIRATORY: No SOB. No wheezing. No chest tightness. No cough.  GASTROINTESTINAL: No vomiting. No constipation. No diarrhea.  GENITOURINARY: Voiding as above in HPI.  NEUROLOGIC: No mental status changes or visual changes. No seizures or paresthesias.   ENDOCRINE: No excessive thirst.  HEMATOLOGIC: No excessive bruising. No excessive bleeding.  SKIN: No itchiness. No rashes.  PSYCHIATRIC: no anxiety. No hallucinations. No depression.   MUSCULOSKETAL: No joint swelling. Normal gait.         PHYSICAL EXAMINATION:   GENERAL: The patient is healthy-appearing. Well developed. Well nourished. No acute distress.   SKIN: No rashes. No lesions.  HEENT: Atraumatic, normocephalic. Tongue midline. Conjunctivae and sclerae are clear. Extraocular movements are full. Oral mucosa normal. Pharynx is negative.  NECK: Supple. There is no lymphadenopathy. No masses are noted. Trachea is midline.   LUNGS: Normal respiratory effort. No wheezing.  CARDIOVASCULAR: Regular pulses in upper extremities.   ABDOMEN: No organomegaly. No masses. No tenderness. No CVA  tenderness.  NEUROLOGICAL: Alert and oriented X 3. Good gait and balance.  PSYCH: Mood stable. Affect normal.  GENITOURINARY: pt preferred with Dr.leung.          LABS reviewed:    PROSTATIC SPECIFIC ANTIGEN (ng/mL)   Date Value   05/09/2013 0.491     No results found for: PSATOTAL  No results found for: PERCTFREEPSA      CREATININE (mg/dL)   Date Value   23/55/7322 1.1         AP:  46 year old male patient seen today  in consultation  for male infertility.    We discussed that vasectomy is considered a permanent form of contraception.    I had an extensive discussion with the patient about vasectomy including risks, benefits, and alternatives. He knows to expect an ambulatory surgical procedure, typically under local anesthesia. The procedure is safe and commonly performed but does carry a small risk of postoperative complications including pain, bleeding/hematoma formation, infection, and injury to or loss of the testis or other surrounding structures. We discussed the post-vasectomy pain syndrome which occurs in a minority of patients but can result in chronic intractable scrotal pain with limited options for management. We discussed the possible need to do the vasectomy in the operating room if it is deemed that it can't be done in procedure suite in clinic.     He understands that he should continue using protection/contraception following the vasectomy, until he presents for a follow up appointment ~3 months after vasectomy.  He understands that he will only be considered sterile if semen analysis at that time confirms the absence of viable sperm. Until that time, he should continue using protection.    He understands that there is a potential risk for recannulization of the vas deferens, and that he may father children in the future. This is a low risk at approximately 1 in 2000 but still present    We discussed some of the factors that are associated with subsequent regret following vasectomy, including  young age (< 29), fewer children, unstable relationship, pressure from partner to have the procedure or hope that a vasectomy will solve marital/sexual problems, decision made during a time of personal crisis, lack of discussion with partner prior to proceeding. Some, none, or all of these factors may apply to any given patient and the patient was encouraged  to consider them before deciding whether to proceed.    The patient understands and agrees, and the patient wishes to proceed.       Plan for Outpatient Vasectomy at [facility].

## 2022-03-27 NOTE — Progress Notes (Unsigned)
Addendum    I saw and examined the patient and agree with the assessment and plan of the above note.    This is a 46 year old male patient with DESIRED infertility.  Plan for vasectomy  Exam- bilateral vas palpable. Loose scrotum    REVIEW OF SYSTEMS:  CONSTITUTIONAL: Negative for any fever, weight loss or weight gain.  HEENT: No visual problems. No sore throat.   CARDIOVASCULAR: No chest pain/palpitation.  PULMONARY: Denies any wheeze. No SOB  GASTROINTESTINAL: Denies any nausea, vomiting. No constipation.  GENITOURINARY: Voiding as above in HPI.  NEUROLOGIC: No mental status changes or visual changes. No seizures or paresthesias. No neurologic changes.  ENDOCRINE: no diabetes. no thyroid disease.  SKIN: Denies any skin lesions. No rashes.  PSYCHIATRIC: no psychiatric illness.  MUSCULOSKETAL: Normal gait.       Past Medical History: reviewed, and no sig changes.   Past Family History: reviewed, and no sig changes.  Social History: reviewed, and no sig changes.  Medications: list reviewed, and without any sig interactions/side effects, unless as noted above in HPI.      PHYSICAL EXAMINATION:  GENERAL: The patient is healthy-appearing. Well developed. Well nourished. No acute distress.  SKIN: No rashes. No lesions.  HEENT: Atraumatic, normocephalic. Tongue midline. Conjunctivae and sclerae are clear. Extraocular movements are full. Oral mucosa normal. Pharynx is negative.  NECK: Supple. There is no lymphadenopathy. No masses are noted. Trachea is midline.   LUNGS: Normal respiratory effort. No wheezing.  CARDIOVASCULAR: Regular pulses in upper extremities.   ABDOMEN: No organomegaly. No masses. No tenderness. No CVA tenderness.  NEUROLOGICAL: Alert and oriented. Good gait and balance.            Orie Fisherman MD

## 2022-04-08 ENCOUNTER — Encounter (HOSPITAL_BASED_OUTPATIENT_CLINIC_OR_DEPARTMENT_OTHER): Payer: Self-pay | Admitting: Internal Medicine

## 2022-04-14 ENCOUNTER — Other Ambulatory Visit: Payer: Self-pay

## 2022-04-14 ENCOUNTER — Ambulatory Visit
Admission: RE | Admit: 2022-04-14 | Discharge: 2022-04-14 | Disposition: A | Discharge status: Home / Self Care | Payer: No Typology Code available for payment source | Attending: Internal Medicine | Admitting: Internal Medicine

## 2022-04-14 DIAGNOSIS — B1910 Unspecified viral hepatitis B without hepatic coma: Secondary | ICD-10-CM | POA: Insufficient documentation

## 2022-04-14 LAB — HEPATITIS A ANTIBODY TOTAL: HEPATITIS A ANTIBODY TOTAL: REACTIVE — AB

## 2022-04-14 NOTE — Progress Notes (Signed)
Hi [name],  Please call patient to let her know blood work shows that she does not need a hepatitis A vaccine.  Much thanks,  [employer]

## 2022-04-17 ENCOUNTER — Telehealth (HOSPITAL_BASED_OUTPATIENT_CLINIC_OR_DEPARTMENT_OTHER): Payer: Self-pay

## 2022-04-17 NOTE — Telephone Encounter (Signed)
Please call patient to let her know blood work shows that she does not need a hepatitis A vaccine.   Much thanks,   [employer]

## 2022-04-17 NOTE — Telephone Encounter (Signed)
Telephone call to pt. Left voicemail message in regards of message from PCP.

## 2022-05-06 ENCOUNTER — Ambulatory Visit (HOSPITAL_BASED_OUTPATIENT_CLINIC_OR_DEPARTMENT_OTHER): Payer: No Typology Code available for payment source | Admitting: Specialist

## 2022-06-15 ENCOUNTER — Ambulatory Visit: Payer: No Typology Code available for payment source | Attending: Ophthalmology | Admitting: Ophthalmology

## 2022-06-15 ENCOUNTER — Encounter (HOSPITAL_BASED_OUTPATIENT_CLINIC_OR_DEPARTMENT_OTHER): Payer: Self-pay | Admitting: Ophthalmology

## 2022-06-15 ENCOUNTER — Other Ambulatory Visit: Payer: Self-pay

## 2022-06-15 DIAGNOSIS — H31001 Unspecified chorioretinal scars, right eye: Secondary | ICD-10-CM | POA: Insufficient documentation

## 2022-06-15 DIAGNOSIS — H5213 Myopia, bilateral: Secondary | ICD-10-CM | POA: Diagnosis present

## 2022-06-15 DIAGNOSIS — Z87828 Personal history of other (healed) physical injury and trauma: Secondary | ICD-10-CM | POA: Insufficient documentation

## 2022-06-15 DIAGNOSIS — H524 Presbyopia: Secondary | ICD-10-CM | POA: Diagnosis present

## 2022-06-15 DIAGNOSIS — H52203 Unspecified astigmatism, bilateral: Secondary | ICD-10-CM | POA: Diagnosis present

## 2022-06-15 DIAGNOSIS — H40003 Preglaucoma, unspecified, bilateral: Secondary | ICD-10-CM | POA: Diagnosis present

## 2022-06-15 NOTE — Progress Notes (Signed)
Pt here for comprehensive eye exam    Pt c/o a decrease in va OD  Denies flashes or floaters ou  Doesn't use eye gtts    LEE- 2010     Optos ou done today

## 2022-06-15 NOTE — Progress Notes (Signed)
For evaluation of "decreased vision " Right eye         Impression,    1. Wide angle fundus photos-    CRS OD temporally              Hx. Remote trauma    2. Large C/D OU/Optic disc cupping    IOP was 21 OD and 18 OS-    Plan-HVF and disc OCT, RTC 6 months    3. Pseudophakia OD

## 2023-10-06 ENCOUNTER — Ambulatory Visit: Payer: No Typology Code available for payment source | Attending: Internal Medicine | Admitting: Internal Medicine

## 2023-10-06 ENCOUNTER — Other Ambulatory Visit: Payer: Self-pay

## 2023-10-06 ENCOUNTER — Encounter (HOSPITAL_BASED_OUTPATIENT_CLINIC_OR_DEPARTMENT_OTHER): Payer: Self-pay | Admitting: Internal Medicine

## 2023-10-06 VITALS — BP 112/68 | HR 92 | Temp 98.1°F | Ht 64.17 in | Wt 145.0 lb

## 2023-10-06 DIAGNOSIS — Z0000 Encounter for general adult medical examination without abnormal findings: Secondary | ICD-10-CM | POA: Diagnosis present

## 2023-10-06 DIAGNOSIS — H538 Other visual disturbances: Secondary | ICD-10-CM | POA: Diagnosis present

## 2023-10-06 DIAGNOSIS — Z3009 Encounter for other general counseling and advice on contraception: Secondary | ICD-10-CM | POA: Insufficient documentation

## 2023-10-06 LAB — LIPID PANEL
Cholesterol: 146 mg/dL (ref 0–239)
HIGH DENSITY LIPOPROTEIN: 36 mg/dL — ABNORMAL LOW (ref 40–60)
LOW DENSITY LIPOPROTEIN DIRECT: 99 mg/dL (ref 0–189)
TRIGLYCERIDES: 118 mg/dL (ref 0–150)

## 2023-10-06 LAB — VITAMIN D,25 HYDROXY: VITAMIN D,25 HYDROXY: 25 ng/mL — ABNORMAL LOW (ref 30.0–100.0)

## 2023-10-06 NOTE — Progress Notes (Signed)
 Cc; PE    Patient Active Problem List    Nephrolithiasis         Priority: Medium [2]         Date Noted: 02/02/2022            H/o nephrolithiasis      h/o Hepatitis B -recovered         Priority: Low [3]         Date Noted: 03/23/2022            2023 Serology - evidence of prior hep B, hep B c            +, hep B sAb +, Hep B Ag             neg; LFTs wnl             now immune            From ID: When people are "Hep B Ab ++, Hep B c Ab            +, hep B Ag negative" they can reactivate. There            are a handful of drugs that are notorious for            doing this (most notably Rituxan and other B-cell            active agents but as the list of immunomodulating            meds grows the lierature is not at all keeping up            with risk stratification here).The presence of            surface Ab in this situations doesn't really            reassure against this possibility --that surface            Ab is generally thought to be incompetent --the            quantitative thresholds were established vis-a-vis            immunization protection and not for these             folks.          Medication List      as of October 06, 2023  9:19 AM     You have not been prescribed any medications.       Review of Patient's Allergies indicates:   Rituximab               Other (See Comments)    Comment:If necessary in future should use caution given             prior hep B infection   Nkda [please verify*      Past Medical History:  No date: NO KNOWN PROBLEMS  Past Surgical History:  07/15/2005: CATARACT REMOVAL INSERTION OF LENS      Comment:  RIGHT EYE - [id number] Jari Favre. Nathaneil Canary, MD  Social History     Socioeconomic History    Marital status: Married     Spouse name: Not on file    Number of children: Not on file    Years of education: Not on file    Highest education level: Not on file   Occupational History    Not on file   Tobacco Use    Smoking status: Never  Smokeless tobacco: Never   Substance and  Sexual Activity    Alcohol use: No    Drug use: No    Sexual activity: Not on file   Other Topics Concern    Not on file   Social History Narrative    he is originally from [place].  He came to the [place] in 2001.     In [place], he did graduated from college    In [place], he worked Education officer, community.     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    He lives with wife & 3 children     he has 3 children : 20 y/o, 73 y/o & 3y/o   Social Drivers of Leisure centre manager: Not on file  Food Insecurity: Not on file  Transportation Needs: Not on file  Physical Activity: Not on file  Stress: Not on file  Social Connections: Not on file  Intimate Partner Violence: Not on file  Housing Stability: Not on file  Review of patient's family history indicates:  Problem: Alcohol/Drug Abuse      Relation: Father          Age of Onset: (Not Specified)  Problem: Heart Disease      Relation: Father          Age of Onset: 24          Comment: died from complications from a hospitalization; heavy               driniking; not sure if from heart or not     ROS: + blurred vision w/ reading    BP 112/68   Pulse 92   Temp 98.1 F (36.7 C) (Temporal)   Ht 5' 4.17" (1.63 m)   Wt 65.8 kg (145 lb)   SpO2 97%   BMI 24.76 kg/m   General: seated comfortable in NAD, alert, pleasant and cooperative  The neck is supple and free of adenopathy or masses, the thyroid is normal without enlargement or nodules.  Heart: S1 and S2 normal, no murmurs, clicks, gallops or rubs. Regular rate and rhythm.   Lungs:  clear; no wheezes, rhonchi or rales.  Abdomen: bowel sounds are normal. soft without tenderness, guarding, mass, rebound or organomegaly.   Neuro: alert & oriented x 3, no dysarthria or aphasia, gait wnl  MMSE: well groomed, affect normal, no tangential thoughts, no pressured speech, no agitation or psychomotor slowing, normal response-time to questions    ASSESSMENT & PLAN:  (Z00.00) Routine general medical examination at a health care facility   (primary encounter diagnosis)  Comment: reviewed all routine health maintenance issues   Plan: LIPID PANEL, LEAD [facility] BLOOD ADULT, VITAMIN         D,25 HYDROXY            (Z30.09) Unwanted fertility  Comment: he's now keen on having vasectomy arranged - understands it's permanent  Plan: REFERRAL TO UROLOGY (INT)            (H53.8) Blurred vision    Plan: REFERRAL TO OPHTHALMOLOGY (INT)              The patient was ready to learn and no apparent learning or adherence barriers were identified. I explained the diagnosis and treatment plan, and the patient expressed understanding of the content. I attempted to answer any questions regarding the diagnosis and the proposed treatment.      he has been advised to call or return with any worsening or  new problems

## 2023-10-07 ENCOUNTER — Telehealth (HOSPITAL_BASED_OUTPATIENT_CLINIC_OR_DEPARTMENT_OTHER): Payer: Self-pay | Admitting: Family Medicine

## 2023-10-07 LAB — LEAD LABCORP BLOOD ADULT: LEAD LABCORP BLOOD ADULT: 1.3 ug/dL (ref 0.0–3.4)

## 2023-10-07 NOTE — Telephone Encounter (Signed)
-----   Message from Greer Ee sent at 10/07/2023 11:04 AM EST -----      he does not have to repeat the lab results in the future.     Thanks, [employer]

## 2023-10-07 NOTE — Progress Notes (Signed)
 he does not have to repeat the lab results in the future.     Thanks, [employer]

## 2023-10-07 NOTE — Telephone Encounter (Signed)
 Called patient with interpreter [phone] unable to reach left message to call clinic.

## 2023-10-08 NOTE — Telephone Encounter (Signed)
 Called patient confirmed name and DOB. Relayed message.

## 2024-06-19 ENCOUNTER — Other Ambulatory Visit: Payer: Self-pay

## 2024-06-19 ENCOUNTER — Telehealth (HOSPITAL_BASED_OUTPATIENT_CLINIC_OR_DEPARTMENT_OTHER): Payer: Self-pay

## 2024-06-19 ENCOUNTER — Emergency Department: Admission: EM | Admit: 2024-06-19 | Discharge: 2024-06-19 | Disposition: A | Discharge status: Home / Self Care

## 2024-06-19 DIAGNOSIS — X58XXXA Exposure to other specified factors, initial encounter: Secondary | ICD-10-CM | POA: Insufficient documentation

## 2024-06-19 DIAGNOSIS — Y929 Unspecified place or not applicable: Secondary | ICD-10-CM | POA: Diagnosis not present

## 2024-06-19 DIAGNOSIS — S0502XA Injury of conjunctiva and corneal abrasion without foreign body, left eye, initial encounter: Secondary | ICD-10-CM | POA: Insufficient documentation

## 2024-06-19 MED ORDER — FLUORESCEIN SODIUM 1 MG OP STRP
1.0000 | ORAL_STRIP | Freq: Once | OPHTHALMIC | Status: AC
Start: 2024-06-19 — End: 2024-06-19
  Administered 2024-06-19: 1 via OPHTHALMIC
  Filled 2024-06-19: qty 1

## 2024-06-19 MED ORDER — TETRACAINE HCL 0.5 % OP SOLN
1.0000 [drp] | Freq: Once | OPHTHALMIC | Status: AC
Start: 2024-06-19 — End: 2024-06-19
  Administered 2024-06-19: 1 [drp] via OPHTHALMIC
  Filled 2024-06-19: qty 4

## 2024-06-19 MED ORDER — OFLOXACIN 0.3 % OP SOLN
2.0000 [drp] | Freq: Four times a day (QID) | OPHTHALMIC | 0 refills | Status: DC
Start: 2024-06-19 — End: 2024-06-26

## 2024-06-19 NOTE — ED Provider Notes (Signed)
 eMERGENCY dEPARTMENT eNCOUnter    I have reviewed the ED nursing notes and prior records.  I have reviewed the patient's past medical history/problem list, allergies, social history, and medication list.  I saw this patient primarily.     Care language: Portuguese ([place]); Interpreter used.       Chief Complaint  Patient presents with:  Eye Problem      HPI, MDM & ED Course  History provided by the patient and wife at bedside.     Devin Kemp is a 48 year old male presents with 3 days of burning sensation to the left eye.  Patient states that he has had a lump in his eyelid for a long time and tried to pop it and feels like the burning may have started after that.  Denies vision changes, denies contact lens use.  Denies purulent discharge, states that he has just had watery discharge. Denies facial pain.  States that it feels like something is in his eye.    Initial VSS, exam with conjunctival injection, small corneal abrasion to the medial aspect of the left cornea.  Visual acuity intact.     Will treat with antibiotic eyedrops and have patient follow-up with ophthalmology.       PMH  Past Medical History:  No date: NO KNOWN PROBLEMS    PSH  Past Surgical History:  07/15/2005: CATARACT REMOVAL INSERTION OF LENS      Comment:  RIGHT EYE - [id number] GLEN[place]OD Elspeth HERO. [place], MD    Physical Exam  ED Triage Vitals [06/19/24 0144]   ED Triage Vitals Brief Group      Temp 97.2 F      Pulse 79      Resp 17      BP 128/90      SpO2 98 %      Pain Score 7        GENERAL: No acute distress.   SKIN:  Warm and dry  HEAD: PERRL. EOMI. conjunctival injection, small corneal abrasion to the medial aspect of the left cornea.  LUNGS: Equal chest rise  HEART:  RRR  NEUROLOGIC: Alert and oriented.  Moves all extremities well.  PSYCHIATRIC:  Appropriate for age, time of day, and situation    Medications Given  Medications   tetracaine (PONTOCAINE) 0.5 % ophthalmic solution 1-2 drop (1 drop Left Eye Document for Another User 06/19/24  0308)   fluorescein ophthalmic strip 1 strip (1 strip Left Eye Document for Another User 06/19/24 0308)       Results  I independently reviewed all pertinent labs & imaging studies.   Labs Reviewed - No data to display  No orders to display    Final Impression  Abrasion of left cornea, initial encounter    Follow up    Follow-up Information       Follow up With Specialties Details Why Contact Info    [facility] POST-ED FOLLOW UP  Schedule an appointment as soon as possible for a visit   Call [phone] Next Business Day For A Same  Day [facility] Appointment. [facility] Is  Open M-f 8am-5pm And Weekends 9am-5pm  [phone]    Gust Pounds, MD Internal Medicine   [address]  [place] [address]  [phone]  [phone]              Disposition: Discharge    Patient informed of results and their likely diagnosis.  Discussed outpatient management,  follow-up and return precautions prior to discharge.     Chrishelle Zito O'Sullivan, MD  Attending Physician  [facility]

## 2024-06-19 NOTE — Telephone Encounter (Addendum)
 Called patient. Can't come in tomorrow    Scheduled on on Monday morning 11/17

## 2024-06-19 NOTE — Narrator Note (Signed)
Patient Disposition  Patient education for diagnosis, medications, activity, diet and follow-up.  Patient left ED 3:08 AM.  Patient rep received written instructions.    Interpreter to provide instructions: No    Patient belongings with patient: YES    Have all existing LDAs been addressed? N/A    Have all IV infusions been stopped? N/A    Destination: Discharged to home

## 2024-06-19 NOTE — ED Triage Note (Signed)
 48 yo M presents to ED via triage with c/o gradual L eye irritation and swelling x3 days. Pt denies any known trauma/injury. Endorses noting a hard ball to his eye x3 days that he attempted to pop. Shortly after pt began to experience burning and stinging to eye. +redness +tearing +swelling +light sensitivity. Denies changes to vision/HA/purulent drainage/increased eye pressure. Even, unlabored respirations. A&Ox4; clear speech. Neuro intact

## 2024-06-19 NOTE — Discharge Instructions (Addendum)
 Voc foi atendido(a) no pronto-socorro devido a irritao no olho esquerdo. Durante o exame, parece haver uma pequena abraso ou arranho na crnea esquerda. Por favor, utilize o colrio antibitico. Voc tambm pode tomar ibuprofeno, se necessrio, para aliviar o desconforto. Agende uma consulta de retorno na clnica oftalmolgica para reavaliao.

## 2024-06-19 NOTE — Telephone Encounter (Signed)
-----   Message from Jil Schneider sent at 06/19/2024  1:18 PM EST -----  Devin Kemp,  Seen in ED - see if he would like [facility] in person slot tomorrow....  Much thanks,  [employer]

## 2024-06-24 NOTE — ED Triage Notes (Signed)
 Patient is complaining of over one week of red eye which is getting worse for past two days. Ofloxacin eye drops [facility] ED Sunday night. Pain and tearing, photophobia, foreign body sensation.

## 2024-06-24 NOTE — ED Provider Notes (Signed)
-------------------------------------------------------------------------------    Attestation signed by Jacqualyn Mila Norris, MD at 06/25/2024  4:39 PM  I examined the patient personally and agree with the exam, assessment, and plan.  Mila CHARLENA Sport, MD, EMHL    -------------------------------------------------------------------------------      Assessment and Plan   #Corneal dellen, OS  #Possible former foreign body, OS  48 y.o. male  - History notable for pain, redness, and photophobia OS over the last week. Works as a psychologist, occupational and felt that he may have gotten some metal in his eye but has not been able to remove. Has been on ofloxacin TID x 7 days without significant improvement in symptoms.   - BCVA VA: 20/20 OS  - Exam notable for nasal pterygium with growth onto cornea, with small area of depression slightly temporal to nasal pterygium without obvious foreign body in cornea. Of note, incidental buried small foreign body inferior to pterygium seen in sclera/conj.    Exam consistent with former foreign body which fell out leaving slight area of depression/dellen temporal to pterygium. Also on differential diagnosis is possible inflammation of pterygium, but overall does not look too inflamed.     Plan:  - Start erythromycin  ointment QID OS  - Start ATs (Systane, Refresh) QID OS    Follow-up:  - Follow-up with ED follow-up clinic or local ophthalmologist (patient has appointment on Monday) in 1 week  - Return precautions including decreased/change in vision, pain, and worsening in symptoms discussed.    Kim Sekimitsu, PGY-2    This patient encounter was supervised by Dr. Jacqualyn who agrees with the above assessment and plan.         Omie Kim, MD  Resident  06/24/24 2110       Jacqualyn Mila Norris, MD  06/25/24 [phone]

## 2024-06-26 ENCOUNTER — Other Ambulatory Visit: Payer: Self-pay

## 2024-06-26 ENCOUNTER — Encounter (HOSPITAL_BASED_OUTPATIENT_CLINIC_OR_DEPARTMENT_OTHER): Payer: Self-pay | Admitting: Ophthalmology

## 2024-06-26 ENCOUNTER — Ambulatory Visit: Attending: Internal Medicine | Admitting: Internal Medicine

## 2024-06-26 ENCOUNTER — Ambulatory Visit: Attending: Ophthalmology | Admitting: Ophthalmology

## 2024-06-26 VITALS — BP 111/73 | HR 85 | Temp 97.8°F | Wt 153.0 lb

## 2024-06-26 DIAGNOSIS — H1849 Other corneal degeneration: Secondary | ICD-10-CM | POA: Diagnosis present

## 2024-06-26 DIAGNOSIS — H11003 Unspecified pterygium of eye, bilateral: Secondary | ICD-10-CM | POA: Diagnosis not present

## 2024-06-26 DIAGNOSIS — H5712 Ocular pain, left eye: Secondary | ICD-10-CM | POA: Insufficient documentation

## 2024-06-26 DIAGNOSIS — R1909 Other intra-abdominal and pelvic swelling, mass and lump: Secondary | ICD-10-CM | POA: Diagnosis not present

## 2024-06-26 DIAGNOSIS — Z961 Presence of intraocular lens: Secondary | ICD-10-CM | POA: Diagnosis present

## 2024-06-26 DIAGNOSIS — H40003 Preglaucoma, unspecified, bilateral: Secondary | ICD-10-CM | POA: Diagnosis present

## 2024-06-26 NOTE — Progress Notes (Signed)
 Cc: eye problem & left inguinal problem    Eye problem  left eye with suspected recent foreign body  Reviewed last two ED visits  He has apt w/ ophthalmology pending today   Notes that redness continues but that the photophobia & other symptoms are improved    left inguinal bulge  He notes no pain but slight bulging on left inguinal region  No urinary, penile or testicular symptoms    BP 111/73 (Site: RA, Position: Sitting, Cuff Size: Reg)   Pulse 85   Temp 97.8 F (36.6 C) (Temporal)   Wt 69.4 kg (153 lb)   SpO2 97%   BMI 26.12 kg/m   Genitals normal; both testes normal without tenderness, masses, hydroceles, varicoceles, erythema or swelling. Shaft normal, meatus normal without discharge. + slight reducible prominence of left inguinal region. No inguinal lymphadenopathy.      ASSESSMENT & PLAN:  (R19.09) Inguinal bulge  Comment: likely small inguinal hernia, reducible - we discussed that surgery is usually optional in these situation - reviewed symptoms of incarceration that would require immediate ED visit - but that in general if not causing any pain or other symptoms pts can observe; meeting with surgeon is optional - he'll think about it - does not want to schedule surgery consult at this point but can call at any time & will call if symptoms worsen      (H57.12) Left eye pain  Comment: reviewed all notes - we discussed importance of follow up with ophthalmology later today      The patient was ready to learn and no apparent learning or adherence barriers were identified. I explained the diagnosis and treatment plan, and the patient expressed understanding of the content. I attempted to answer any questions regarding the diagnosis and the proposed treatment.      he has been advised to call or return with any worsening or new problems

## 2024-06-26 NOTE — Progress Notes (Signed)
 Pt here for urgent care visit    Pt had been seen at [facility] ED 06-19-24 and then at [facility] 06-24-24 due to pain and redness OS X 3 days   Pt has been using Erythromycin  ung QID OS x 3 days and he no longer has  pain but continues to have redness OS  Denies changes in va ou  Pt isn't using any OTC gtts at this time    Ocular HX-Chorioretinal scar OD  History of eye trauma  Myopia,astigmatism and presbyopia  Glaucoma suspect

## 2024-06-26 NOTE — Progress Notes (Signed)
 Impression:  Corneal FB/dellen OS:  Bothered for a week with irritation OS.  Seen by [facility] ED and given antibiotic drops.  When no improvement, patient went to [facility] ED and diagnosed with corneal dellen.  Pain responded well to erythromycin  ointment qid prescribed by [facility].  No corneal defect today.  Pterygium OU: Inflamed OS.  Better per patient  Glaucoma suspect OD:  Hx trauma OD with chorioretinal scarring/traumatic cataract.  IOP elevated OD>OS.  Prominent cupping OD>OS.  Needs testing  Pseudophakia OD    Plan:  Continue erythromycin  ointment OS qid X 4 more days.  Call with worsening.  Return for formal visual field testing and optic nerve imaging.  See with results.  Needs pachymetry

## 2024-11-10 ENCOUNTER — Ambulatory Visit
